# Patient Record
Sex: Male | Born: 1965 | Race: White | Hispanic: No | Marital: Married | Smoking: Former smoker
Health system: Southern US, Community
[De-identification: ages and names within clinical notes are randomized; demographics above are authoritative.]

## PROBLEM LIST (undated history)

## (undated) DIAGNOSIS — K219 Gastro-esophageal reflux disease without esophagitis: Secondary | ICD-10-CM

## (undated) DIAGNOSIS — J189 Pneumonia, unspecified organism: Secondary | ICD-10-CM

## (undated) DIAGNOSIS — J90 Pleural effusion, not elsewhere classified: Secondary | ICD-10-CM

## (undated) DIAGNOSIS — J309 Allergic rhinitis, unspecified: Secondary | ICD-10-CM

## (undated) DIAGNOSIS — I10 Essential (primary) hypertension: Secondary | ICD-10-CM

## (undated) HISTORY — PX: HERNIA REPAIR: SHX51

---

## 2004-11-12 ENCOUNTER — Ambulatory Visit: Payer: Self-pay | Admitting: Family Medicine

## 2004-11-23 ENCOUNTER — Ambulatory Visit: Payer: Self-pay | Admitting: Family Medicine

## 2016-05-08 ENCOUNTER — Inpatient Hospital Stay (HOSPITAL_COMMUNITY)
Admission: EM | Admit: 2016-05-08 | Discharge: 2016-05-18 | DRG: 163 | Disposition: A | Discharge status: Home / Self Care | Payer: BLUE CROSS/BLUE SHIELD | Attending: Surgery | Admitting: Surgery

## 2016-05-08 ENCOUNTER — Encounter (HOSPITAL_COMMUNITY): Payer: Self-pay

## 2016-05-08 ENCOUNTER — Emergency Department (HOSPITAL_COMMUNITY): Payer: BLUE CROSS/BLUE SHIELD

## 2016-05-08 DIAGNOSIS — J181 Lobar pneumonia, unspecified organism: Principal | ICD-10-CM | POA: Diagnosis present

## 2016-05-08 DIAGNOSIS — J189 Pneumonia, unspecified organism: Secondary | ICD-10-CM | POA: Diagnosis present

## 2016-05-08 DIAGNOSIS — R739 Hyperglycemia, unspecified: Secondary | ICD-10-CM | POA: Diagnosis present

## 2016-05-08 DIAGNOSIS — Z9889 Other specified postprocedural states: Secondary | ICD-10-CM

## 2016-05-08 DIAGNOSIS — F17201 Nicotine dependence, unspecified, in remission: Secondary | ICD-10-CM

## 2016-05-08 DIAGNOSIS — J869 Pyothorax without fistula: Secondary | ICD-10-CM

## 2016-05-08 DIAGNOSIS — Z09 Encounter for follow-up examination after completed treatment for conditions other than malignant neoplasm: Secondary | ICD-10-CM

## 2016-05-08 DIAGNOSIS — J948 Other specified pleural conditions: Secondary | ICD-10-CM | POA: Diagnosis not present

## 2016-05-08 DIAGNOSIS — E871 Hypo-osmolality and hyponatremia: Secondary | ICD-10-CM | POA: Diagnosis present

## 2016-05-08 DIAGNOSIS — Z79899 Other long term (current) drug therapy: Secondary | ICD-10-CM

## 2016-05-08 DIAGNOSIS — J90 Pleural effusion, not elsewhere classified: Secondary | ICD-10-CM

## 2016-05-08 DIAGNOSIS — I10 Essential (primary) hypertension: Secondary | ICD-10-CM | POA: Diagnosis present

## 2016-05-08 DIAGNOSIS — J9811 Atelectasis: Secondary | ICD-10-CM

## 2016-05-08 HISTORY — DX: Allergic rhinitis, unspecified: J30.9

## 2016-05-08 HISTORY — DX: Essential (primary) hypertension: I10

## 2016-05-08 HISTORY — DX: Pleural effusion, not elsewhere classified: J90

## 2016-05-08 LAB — CBC WITH DIFFERENTIAL/PLATELET
Basophils Absolute: 0 10*3/uL (ref 0.0–0.1)
Basophils Relative: 0 %
EOS ABS: 0.1 10*3/uL (ref 0.0–0.7)
EOS PCT: 0 %
HCT: 42.4 % (ref 39.0–52.0)
Hemoglobin: 14.3 g/dL (ref 13.0–17.0)
LYMPHS ABS: 1.1 10*3/uL (ref 0.7–4.0)
LYMPHS PCT: 5 %
MCH: 29.5 pg (ref 26.0–34.0)
MCHC: 33.7 g/dL (ref 30.0–36.0)
MCV: 87.6 fL (ref 78.0–100.0)
MONO ABS: 1.8 10*3/uL — AB (ref 0.1–1.0)
Monocytes Relative: 8 %
Neutro Abs: 18.6 10*3/uL — ABNORMAL HIGH (ref 1.7–7.7)
Neutrophils Relative %: 87 %
Platelets: 530 10*3/uL — ABNORMAL HIGH (ref 150–400)
RBC: 4.84 MIL/uL (ref 4.22–5.81)
RDW: 12.4 % (ref 11.5–15.5)
WBC: 21.5 10*3/uL — ABNORMAL HIGH (ref 4.0–10.5)

## 2016-05-08 LAB — BASIC METABOLIC PANEL
Anion gap: 12 (ref 5–15)
BUN: 19 mg/dL (ref 6–20)
CALCIUM: 8.6 mg/dL — AB (ref 8.9–10.3)
CHLORIDE: 101 mmol/L (ref 101–111)
CO2: 23 mmol/L (ref 22–32)
CREATININE: 0.91 mg/dL (ref 0.61–1.24)
GFR calc non Af Amer: >60 mL/min (ref >60)
Glucose, Bld: 154 mg/dL — ABNORMAL HIGH (ref 65–99)
Potassium: 4 mmol/L (ref 3.5–5.1)
SODIUM: 136 mmol/L (ref 135–145)

## 2016-05-08 LAB — LACTIC ACID, PLASMA: LACTIC ACID, VENOUS: 1.7 mmol/L (ref 0.5–2.0)

## 2016-05-08 MED ORDER — IPRATROPIUM-ALBUTEROL 0.5-2.5 (3) MG/3ML IN SOLN
3.0000 mL | Freq: Once | RESPIRATORY_TRACT | Status: AC
  Administered 2016-05-08: 3 mL via RESPIRATORY_TRACT
  Filled 2016-05-08: qty 3

## 2016-05-08 MED ORDER — MORPHINE SULFATE (PF) 4 MG/ML IV SOLN
4.0000 mg | Freq: Once | INTRAVENOUS | Status: AC
  Administered 2016-05-08: 4 mg via INTRAVENOUS
  Filled 2016-05-08: qty 1

## 2016-05-08 MED ORDER — ALBUTEROL SULFATE (2.5 MG/3ML) 0.083% IN NEBU
2.5000 mg | INHALATION_SOLUTION | Freq: Once | RESPIRATORY_TRACT | Status: AC
  Administered 2016-05-08: 2.5 mg via RESPIRATORY_TRACT
  Filled 2016-05-08: qty 3

## 2016-05-08 MED ORDER — DEXTROSE 5 % IV SOLN
500.0000 mg | INTRAVENOUS | Status: DC
  Administered 2016-05-09 – 2016-05-11 (×3): 500 mg via INTRAVENOUS
  Filled 2016-05-08 (×4): qty 500

## 2016-05-08 MED ORDER — CEFTRIAXONE SODIUM 2 G IJ SOLR
2.0000 g | Freq: Once | INTRAMUSCULAR | Status: AC
  Administered 2016-05-08: 2 g via INTRAVENOUS
  Filled 2016-05-08: qty 2

## 2016-05-08 MED ORDER — ACETAMINOPHEN 500 MG PO TABS
1000.0000 mg | ORAL_TABLET | Freq: Once | ORAL | Status: AC
  Administered 2016-05-08: 1000 mg via ORAL
  Filled 2016-05-08: qty 2

## 2016-05-08 MED ORDER — ONDANSETRON HCL 4 MG/2ML IJ SOLN
4.0000 mg | Freq: Once | INTRAMUSCULAR | Status: AC
  Administered 2016-05-08: 4 mg via INTRAVENOUS
  Filled 2016-05-08: qty 2

## 2016-05-08 MED ORDER — SODIUM CHLORIDE 0.9 % IV BOLUS (SEPSIS)
1000.0000 mL | Freq: Once | INTRAVENOUS | Status: AC
  Administered 2016-05-08: 1000 mL via INTRAVENOUS

## 2016-05-08 NOTE — ED Notes (Signed)
[place] [facility] [place] [facility]  Was told if I got worse to come to the ER.  Coughing, wheezing, pain in lungs, trouble breathing.

## 2016-05-08 NOTE — ED Provider Notes (Signed)
CSN: [id number]     Arrival date & time 05/08/16  2108 History   First MD Initiated Contact with Patient 05/08/16 2150     Chief Complaint  Patient presents with  . Shortness of Breath     (Consider location/radiation/quality/duration/timing/severity/associated sxs/prior Treatment) HPI   Allen Becker is a 50 y.o. male who presents to the Emergency Department complaining of shortness of breath, cough and upper chest pain for six weeks.  He states that he was initially seen by his PMD and treated with prednisone for bronchitis.  States he was not improving, so returned and given additional steroids for "sinus" reports symptoms continued to worsen and was seen two days ago at local urgent care and diagnosed with pneumonia.  Started Levaquin and advised if not improving to go to ER.  He reports continued symptoms and increasing shortness of breath, worse with exertion.  Fever max at home unknown.  He denies vomiting, abdominal pain, LE edema or hx of heart disease.  Nothing makes sx's better or worse.  Stopped smoking greater than 10 yrs ago.     Past Medical History  Diagnosis Date  . Hypertension   . Allergic rhinitis    History reviewed. No pertinent past surgical history. No family history on file. Social History  Substance Use Topics  . Smoking status: Former Games developer  . Smokeless tobacco: None  . Alcohol Use: No    Review of Systems  Constitutional: Positive for fever and chills. Negative for appetite change.  HENT: Positive for congestion. Negative for sore throat and trouble swallowing.   Respiratory: Positive for cough, chest tightness and shortness of breath. Negative for wheezing.   Cardiovascular: Positive for chest pain.  Gastrointestinal: Negative for nausea, vomiting and abdominal pain.  Genitourinary: Negative for dysuria.  Musculoskeletal: Negative for arthralgias.  Skin: Negative for rash.  Neurological: Negative for dizziness, weakness and numbness.  Hematological:  Negative for adenopathy.  All other systems reviewed and are negative.     Allergies  Review of patient's allergies indicates no known allergies.  Home Medications   Prior to Admission medications   Medication Sig Start Date End Date Taking? Authorizing Provider  amLODipine-valsartan (EXFORGE) 5-160 MG tablet Take 1 tablet by mouth daily. 03/16/16  Yes Historical Provider, MD  atorvastatin (LIPITOR) 10 MG tablet Take 10 mg by mouth daily. 01/01/16 12/31/16 Yes Historical Provider, MD  HYDROcodone-homatropine (HYCODAN) 5-1.5 MG/5ML syrup Take 5 mLs by mouth at bedtime as needed. 05/06/16  Yes Historical Provider, MD  ibuprofen (ADVIL,MOTRIN) 200 MG tablet Take 200 mg by mouth every 6 (six) hours as needed for mild pain or moderate pain.   Yes Historical Provider, MD  levofloxacin (LEVAQUIN) 750 MG tablet Take 750 mg by mouth daily. 7 day course starting on 05/06/2016 05/06/16  Yes Historical Provider, MD   BP 119/76 mmHg  Pulse 109  Temp(Src) 99.9 F (37.7 C) (Oral)  Resp 18  Ht  (1.676 m)  Wt 77.111 kg  BMI 27.45 kg/m2  SpO2 95% Physical Exam  Constitutional: He is oriented to person, place, and time. He appears well-nourished. No distress.  HENT:  Mouth/Throat: Oropharynx is clear and moist.  Eyes: Conjunctivae and EOM are normal. Pupils are equal, round, and reactive to light.  Neck: Normal range of motion. Neck supple. No JVD present.  Cardiovascular: Regular rhythm and intact distal pulses.  Tachycardia present.   Pulmonary/Chest: Effort normal. No respiratory distress.  Mild inspiratory wheezing bilaterally.  Diminished lung sounds on left  Abdominal:  Soft. He exhibits no distension. There is no tenderness. There is no rebound.  Musculoskeletal: Normal range of motion. He exhibits no edema.  Lymphadenopathy:    He has no cervical adenopathy.  Neurological: He is alert and oriented to person, place, and time.  Skin: Skin is warm.  Psychiatric: He has a normal mood and  affect.  Nursing note and vitals reviewed.   ED Course  Procedures (including critical care time) Labs Review Labs Reviewed  BASIC METABOLIC PANEL - Abnormal; Notable for the following:    Glucose, Bld 154 (*)    Calcium 8.6 (*)    All other components within normal limits  CBC WITH DIFFERENTIAL/PLATELET - Abnormal; Notable for the following:    WBC 21.5 (*)    Platelets 530 (*)    Neutro Abs 18.6 (*)    Monocytes Absolute 1.8 (*)    All other components within normal limits  LACTIC ACID, PLASMA  LACTIC ACID, PLASMA    Imaging Review Dg Chest 2 View  05/08/2016  CLINICAL DATA:  Initial evaluation for acute cough, wheezing. EXAM: CHEST  2 VIEW COMPARISON:  None. FINDINGS: Cardiac silhouette grossly within normal limits, although the left heart border is obscured on this exam. Mediastinal silhouette within normal limits. Tracheal air column mildly deviated to the right. Lungs normally inflated. AE large left pleural effusion is present. Associated left basilar atelectasis and/ or consolidation. Superimposed mass not excluded. Left lung apex is aerated. Right lung is clear. No pneumothorax. No acute osseous abnormality. IMPRESSION: Large left pleural effusion with associated left lower lobe atelectasis and/ or consolidation. Superimposed mass not excluded. Further evaluation with cross-sectional imaging is suggested to further delineate these findings. Electronically Signed   By: Rise MuBenjamin  McClintock M.D.   On: 05/08/2016 22:25   Ct Chest Wo Contrast  05/09/2016  CLINICAL DATA:  Shortness of breath. Recent diagnosis of pneumonia. Abnormal chest x-ray. EXAM: CT CHEST WITHOUT CONTRAST TECHNIQUE: Multidetector CT imaging of the chest was performed following the standard protocol without IV contrast. COMPARISON:  Chest radiographs earlier this day. FINDINGS: Large partially loculated left pleural effusion measuring simple fluid density. This causes complete collapse/ atelectasis of the left lower  lobe and moderate left upper lobe atelectasis. Tiny, 3 mm, pleural-based nodule was seen in the aerated left upper lobe posteriorly. There is mild rightward mediastinal shift secondary to large pleural effusion. Scattered air bronchograms are seen. No evidence of endobronchial lesion. No focal consolidation in the right lung. No right pleural effusion. No evidence pulmonary edema. Small mediastinal lymph nodes are likely reactive. Limited assessment for hilar adenopathy given lack of contrast. The thoracic aorta is normal in caliber. No acute abnormality in the included upper abdomen. No adrenal nodule. There are no acute or suspicious osseous abnormalities. IMPRESSION: 1. Large partially loculated left pleural effusion. This cause of complete collapse/atelectasis of the left lower lobe and moderate left upper lobe atelectasis. This limits assessment of the underlying lung parenchyma. Underlying pneumonia or pulmonary mass cannot be assessed. Recommend follow-up imaging after drainage or resolution of pleural fluid. 2. No focal abnormality in the right lung. Electronically Signed   By: Rubye OaksMelanie  Ehinger M.D.   On: 05/09/2016 00:09   I have personally reviewed and evaluated these images and lab results as part of my medical decision-making.   EKG Interpretation   Date/Time:  Saturday May 08 2016 21:29:45 EDT Ventricular Rate:  117 PR Interval:  113 QRS Duration: 77 QT Interval:  278 QTC Calculation: 388 R Axis:  55 Text Interpretation:  Sinus tachycardia Probable LVH with secondary repol  abnrm No previous ECGs available Confirmed by ZACKOWSKI  MD, SCOTT [phone])  on 05/08/2016 9:37:00 PM      MDM   Final diagnoses:  Pleural effusion on left   Pt is resting comfortably.  Non-toxic appearing.    Stopped prednisone two days ago.  Failing outpatient therapy with Levaquin.  Will start rocephin and zithromax., albuterol neb given fever improved after tylenol.  Vitals stable.  Labs and CT scan  discussed with patient.  Will consult hospitalist, Dr. Conley Rolls, for admit.    2350 Consulted Dr. Conley Rolls who agrees to admit the pt.  Will see in ER   Pauline Aus, PA-C 05/09/16 1191  Vanetta Mulders, MD 05/12/16 [phone]

## 2016-05-09 ENCOUNTER — Encounter (HOSPITAL_COMMUNITY): Payer: Self-pay | Admitting: Internal Medicine

## 2016-05-09 DIAGNOSIS — J189 Pneumonia, unspecified organism: Secondary | ICD-10-CM | POA: Diagnosis present

## 2016-05-09 DIAGNOSIS — R030 Elevated blood-pressure reading, without diagnosis of hypertension: Secondary | ICD-10-CM | POA: Diagnosis not present

## 2016-05-09 DIAGNOSIS — J90 Pleural effusion, not elsewhere classified: Secondary | ICD-10-CM | POA: Diagnosis present

## 2016-05-09 DIAGNOSIS — J181 Lobar pneumonia, unspecified organism: Secondary | ICD-10-CM | POA: Diagnosis present

## 2016-05-09 DIAGNOSIS — E871 Hypo-osmolality and hyponatremia: Secondary | ICD-10-CM | POA: Diagnosis present

## 2016-05-09 DIAGNOSIS — Z79899 Other long term (current) drug therapy: Secondary | ICD-10-CM | POA: Diagnosis not present

## 2016-05-09 DIAGNOSIS — J869 Pyothorax without fistula: Secondary | ICD-10-CM | POA: Diagnosis not present

## 2016-05-09 DIAGNOSIS — J948 Other specified pleural conditions: Secondary | ICD-10-CM | POA: Diagnosis present

## 2016-05-09 DIAGNOSIS — R739 Hyperglycemia, unspecified: Secondary | ICD-10-CM | POA: Diagnosis not present

## 2016-05-09 DIAGNOSIS — J9811 Atelectasis: Secondary | ICD-10-CM | POA: Diagnosis present

## 2016-05-09 DIAGNOSIS — J852 Abscess of lung without pneumonia: Secondary | ICD-10-CM | POA: Diagnosis not present

## 2016-05-09 DIAGNOSIS — F17201 Nicotine dependence, unspecified, in remission: Secondary | ICD-10-CM | POA: Diagnosis not present

## 2016-05-09 DIAGNOSIS — I10 Essential (primary) hypertension: Secondary | ICD-10-CM | POA: Diagnosis not present

## 2016-05-09 LAB — LACTIC ACID, PLASMA: Lactic Acid, Venous: 1 mmol/L (ref 0.5–2.0)

## 2016-05-09 LAB — PROTIME-INR
INR: 1.33 (ref 0.00–1.49)
PROTHROMBIN TIME: 16.6 s — AB (ref 11.6–15.2)

## 2016-05-09 MED ORDER — ONDANSETRON HCL 4 MG PO TABS: 4.0000 mg | ORAL_TABLET | Freq: Four times a day (QID) | ORAL | Status: DC | PRN

## 2016-05-09 MED ORDER — IRBESARTAN 150 MG PO TABS
150.0000 mg | ORAL_TABLET | Freq: Every day | ORAL | Status: DC
  Administered 2016-05-09 – 2016-05-10 (×2): 150 mg via ORAL
  Filled 2016-05-09 (×2): qty 1

## 2016-05-09 MED ORDER — DEXTROSE 5 % IV SOLN: 500.0000 mg | INTRAVENOUS | Status: DC

## 2016-05-09 MED ORDER — AMLODIPINE BESYLATE 5 MG PO TABS
5.0000 mg | ORAL_TABLET | Freq: Every day | ORAL | Status: DC
  Administered 2016-05-09 – 2016-05-10 (×2): 5 mg via ORAL
  Filled 2016-05-09 (×2): qty 1

## 2016-05-09 MED ORDER — HEPARIN SODIUM (PORCINE) 5000 UNIT/ML IJ SOLN
5000.0000 [IU] | Freq: Three times a day (TID) | INTRAMUSCULAR | Status: DC
  Administered 2016-05-09 – 2016-05-12 (×9): 5000 [IU] via SUBCUTANEOUS
  Filled 2016-05-09 (×8): qty 1

## 2016-05-09 MED ORDER — CETYLPYRIDINIUM CHLORIDE 0.05 % MT LIQD
7.0000 mL | Freq: Two times a day (BID) | OROMUCOSAL | Status: DC
  Administered 2016-05-09 – 2016-05-12 (×7): 7 mL via OROMUCOSAL

## 2016-05-09 MED ORDER — ACETAMINOPHEN 650 MG RE SUPP: 650.0000 mg | Freq: Four times a day (QID) | RECTAL | Status: DC | PRN

## 2016-05-09 MED ORDER — ATORVASTATIN CALCIUM 10 MG PO TABS
10.0000 mg | ORAL_TABLET | Freq: Every day | ORAL | Status: DC
  Administered 2016-05-09 – 2016-05-12 (×4): 10 mg via ORAL
  Filled 2016-05-09 (×4): qty 1

## 2016-05-09 MED ORDER — MORPHINE SULFATE (PF) 2 MG/ML IV SOLN
2.0000 mg | INTRAVENOUS | Status: DC | PRN
  Administered 2016-05-09 – 2016-05-12 (×9): 2 mg via INTRAVENOUS
  Filled 2016-05-09 (×9): qty 1

## 2016-05-09 MED ORDER — ONDANSETRON HCL 4 MG/2ML IJ SOLN: 4.0000 mg | Freq: Four times a day (QID) | INTRAMUSCULAR | Status: DC | PRN

## 2016-05-09 MED ORDER — DEXTROSE 5 % IV SOLN
1.0000 g | INTRAVENOUS | Status: DC
  Administered 2016-05-09 – 2016-05-11 (×2): 1 g via INTRAVENOUS
  Filled 2016-05-09 (×3): qty 10

## 2016-05-09 MED ORDER — ACETAMINOPHEN 325 MG PO TABS
650.0000 mg | ORAL_TABLET | Freq: Four times a day (QID) | ORAL | Status: DC | PRN
  Administered 2016-05-09 – 2016-05-11 (×5): 650 mg via ORAL
  Filled 2016-05-09 (×4): qty 2

## 2016-05-09 MED ORDER — AMLODIPINE BESYLATE-VALSARTAN 5-160 MG PO TABS: 1.0000 | ORAL_TABLET | Freq: Every day | ORAL | Status: DC

## 2016-05-09 NOTE — Progress Notes (Signed)
Patient seen and examined, database reviewed.  discussed with multiple family members at bedside and updated of plan of care. Patient presented overnight with shortness of breath and cough. chest x-ray and CT showed a large partially loculated left sided pleural effusion. He remains with significant work of breathing. [facility] for both diagnostic and therapeutic purposes. He remains on empiric antibiotic therapy for pneumonia, unable to fully discard malignancy given his history of tobacco abuse, cytology has been requested from pleural fluid. We'll continue to follow.   Peggye PittEstela Hernandez, MD [facility] Pager: [phone]

## 2016-05-09 NOTE — H&P (Signed)
History and Physical    Allen Becker RUE:[id number] DOB: 02-Jun-1966 DOA: 05/08/2016  Referring MD/NP/PA: Babette Relicammy triplett PCP: No primary care provider on file.  Outpatient Specialists:  Patient coming from: home  Chief Complaint: Shortness of breath and cough  HPI: Allen Becker is a 50 y.o. male with medical history significant of tobacco use (approximately 1ppd for 20 yrs) presents with complaints of productive cough, SOB, and chest pain for the past month and half. He was recently seen at an urgent care clinic and was diagnosed with PNA. He was given Levaquin and told to come to the hospital if his symptoms worsened.  He endorses subjective fever and chills. Denies any heart problems.  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 He still works and does not normally miss work.   While in the ED, BMP was unremarkable. CBC showed elevated WBC at 21.5, though his lactic acid was wnl. CXR and CT chest showed a large partially loculated left-sided pleural effusion. Hospitalist was asked to refer for admission.   Review of Systems: As per HPI otherwise 10 point review of systems negative.    Past Medical History  Diagnosis Date  . Hypertension   . Allergic rhinitis     History reviewed. No pertinent past surgical history.   reports that he has quit smoking. He does not have any smokeless tobacco history on file. He reports that he does not drink alcohol or use illicit drugs.  No Known Allergies  No family history on file.  Prior to Admission medications   Medication Sig Start Date End Date Taking? Authorizing Provider  amLODipine-valsartan (EXFORGE) 5-160 MG tablet Take 1 tablet by mouth daily. 03/16/16  Yes Historical Provider, MD  atorvastatin (LIPITOR) 10 MG tablet Take 10 mg by mouth daily. 01/01/16 12/31/16 Yes Historical Provider, MD  HYDROcodone-homatropine (HYCODAN) 5-1.5 MG/5ML syrup Take 5 mLs by mouth at bedtime  as needed. 05/06/16  Yes Historical Provider, MD  ibuprofen (ADVIL,MOTRIN) 200 MG tablet Take 200 mg by mouth every 6 (six) hours as needed for mild pain or moderate pain.   Yes Historical Provider, MD  levofloxacin (LEVAQUIN) 750 MG tablet Take 750 mg by mouth daily. 7 day course starting on 05/06/2016 05/06/16  Yes Historical Provider, MD    Physical Exam: Filed Vitals:   05/08/16 2248 05/08/16 2300 05/08/16 2301 05/09/16 0036  BP:  122/78  109/80  Pulse:  97  97  Temp: 98.3 F (36.8 C)   99.4 F (37.4 C)  TempSrc: Oral   Oral  Resp:  19  18  Height:      Weight:      SpO2:  97% 95% 92%      Constitutional: NAD, calm, comfortable Filed Vitals:   05/08/16 2248 05/08/16 2300 05/08/16 2301 05/09/16 0036  BP:  122/78  109/80  Pulse:  97  97  Temp: 98.3 F (36.8 C)   99.4 F (37.4 C)  TempSrc: Oral   Oral  Resp:  19  18  Height:      Weight:      SpO2:  97% 95% 92%   Eyes: PERRL, lids and conjunctivae normal ENMT: Mucous membranes are moist. Posterior pharynx clear of any exudate or lesions.Normal dentition.  Neck: normal, supple, no masses, no thyromegaly Respiratory: clear to auscultation bilaterally, no wheezing, no crackles. Normal respiratory effort. No accessory muscle use. E-A changes on left side.  Cardiovascular: Regular rate and rhythm, no murmurs / rubs /  gallops. No extremity edema. 2+ pedal pulses. No carotid bruits.  Abdomen: no tenderness, no masses palpated. No hepatosplenomegaly. Bowel sounds positive.  Musculoskeletal: no clubbing / cyanosis. No joint deformity upper and lower extremities. Good ROM, no contractures. Normal muscle tone.  Skin: no rashes, lesions, ulcers. No induration Neurologic: CN 2-12 grossly intact. Sensation intact, DTR normal. Strength 5/5 in all 4.  Psychiatric: Normal judgment and insight. Alert and oriented x 3. Normal mood.   Labs on Admission: I have personally reviewed following labs and imaging studies  CBC:  Recent Labs Lab  05/08/16 2132  WBC 21.5*  NEUTROABS 18.6*  HGB 14.3  HCT 42.4  MCV 87.6  PLT 530*   Basic Metabolic Panel:  Recent Labs Lab 05/08/16 2132  NA 136  K 4.0  CL 101  CO2 23  GLUCOSE 154*  BUN 19  CREATININE 0.91  CALCIUM 8.6*   Radiological Exams on Admission: Dg Chest 2 View  05/08/2016  CLINICAL DATA:  Initial evaluation for acute cough, wheezing. EXAM: CHEST  2 VIEW COMPARISON:  None. FINDINGS: Cardiac silhouette grossly within normal limits, although the left heart border is obscured on this exam. Mediastinal silhouette within normal limits. Tracheal air column mildly deviated to the right. Lungs normally inflated. AE large left pleural effusion is present. Associated left basilar atelectasis and/ or consolidation. Superimposed mass not excluded. Left lung apex is aerated. Right lung is clear. No pneumothorax. No acute osseous abnormality. IMPRESSION: Large left pleural effusion with associated left lower lobe atelectasis and/ or consolidation. Superimposed mass not excluded. Further evaluation with cross-sectional imaging is suggested to further delineate these findings. Electronically Signed   By: Rise Mu M.D.   On: 05/08/2016 22:25   Ct Chest Wo Contrast  05/09/2016  CLINICAL DATA:  Shortness of breath. Recent diagnosis of pneumonia. Abnormal chest x-ray. EXAM: CT CHEST WITHOUT CONTRAST TECHNIQUE: Multidetector CT imaging of the chest was performed following the standard protocol without IV contrast. COMPARISON:  Chest radiographs earlier this day. FINDINGS: Large partially loculated left pleural effusion measuring simple fluid density. This causes complete collapse/ atelectasis of the left lower lobe and moderate left upper lobe atelectasis. Tiny, 3 mm, pleural-based nodule was seen in the aerated left upper lobe posteriorly. There is mild rightward mediastinal shift secondary to large pleural effusion. Scattered air bronchograms are seen. No evidence of endobronchial  lesion. No focal consolidation in the right lung. No right pleural effusion. No evidence pulmonary edema. Small mediastinal lymph nodes are likely reactive. Limited assessment for hilar adenopathy given lack of contrast. The thoracic aorta is normal in caliber. No acute abnormality in the included upper abdomen. No adrenal nodule. There are no acute or suspicious osseous abnormalities. IMPRESSION: 1. Large partially loculated left pleural effusion. This cause of complete collapse/atelectasis of the left lower lobe and moderate left upper lobe atelectasis. This limits assessment of the underlying lung parenchyma. Underlying pneumonia or pulmonary mass cannot be assessed. Recommend follow-up imaging after drainage or resolution of pleural fluid. 2. No focal abnormality in the right lung. Electronically Signed   By: Rubye Oaks M.D.   On: 05/09/2016 00:09    EKG: Independently reviewed.   Assessment/Plan Active Problems:   Pleural effusion on left  1. Left-sided pleural effusion:  CT chest shows large partially loculated left pleural effusion. Concern for malignant pleural effusion, but will treat for parapneumonic effusion.  He has a hx of smoking 1ppd for 20 years, but quit approximately 10 years ago. . WBC elevated 21.5 but  he has been on Prednisone.  Treat with abx and pulmonary hygiene. He does not need any O2 at this time. Will perform thoracentesis to include serology, culture and cytology.  I will  consult pulmonology.   DVT prophylaxis: Heparin Code Status: Full Family Communication: Discussed with patient, daughter, and son present at bedside.  Disposition Plan: Discharge home once improved Consults called: Will consult Pulmonology Admission status: Admit as inpatient.   Houston Siren, MD FACP [facility]  If 7PM-7AM, please contact night-coverage [email or web address] Password [email or web address]  05/09/2016, 12:54 AM   By signing my name below, I, Adron Bene, attest that this documentation  has been prepared under the direction and in the presence of Houston Siren, MD. Electronically Signed: Adron Bene, Scribe 05/09/2016 12:55am

## 2016-05-10 ENCOUNTER — Ambulatory Visit (HOSPITAL_COMMUNITY)
Admission: RE | Admit: 2016-05-10 | Payer: Self-pay | Source: Other Acute Inpatient Hospital | Admitting: Internal Medicine

## 2016-05-10 ENCOUNTER — Inpatient Hospital Stay (HOSPITAL_COMMUNITY): Payer: BLUE CROSS/BLUE SHIELD

## 2016-05-10 ENCOUNTER — Encounter (HOSPITAL_COMMUNITY): Payer: Self-pay | Admitting: Internal Medicine

## 2016-05-10 DIAGNOSIS — J90 Pleural effusion, not elsewhere classified: Secondary | ICD-10-CM

## 2016-05-10 DIAGNOSIS — I10 Essential (primary) hypertension: Secondary | ICD-10-CM | POA: Diagnosis present

## 2016-05-10 DIAGNOSIS — R739 Hyperglycemia, unspecified: Secondary | ICD-10-CM

## 2016-05-10 HISTORY — DX: Pleural effusion, not elsewhere classified: J90

## 2016-05-10 LAB — GRAM STAIN

## 2016-05-10 LAB — BODY FLUID CELL COUNT WITH DIFFERENTIAL
EOS FL: 0 %
Lymphs, Fluid: 69 %
Monocyte-Macrophage-Serous Fluid: 9 % — ABNORMAL LOW (ref 50–90)
Neutrophil Count, Fluid: 22 % (ref 0–25)
Total Nucleated Cell Count, Fluid: 967 cu mm (ref 0–1000)

## 2016-05-10 LAB — GLUCOSE, SEROUS FLUID: Glucose, Fluid: 92 mg/dL

## 2016-05-10 LAB — COMPREHENSIVE METABOLIC PANEL
ALT: 94 U/L — ABNORMAL HIGH (ref 17–63)
ANION GAP: 8 (ref 5–15)
AST: 41 U/L (ref 15–41)
Albumin: 2.7 g/dL — ABNORMAL LOW (ref 3.5–5.0)
Alkaline Phosphatase: 147 U/L — ABNORMAL HIGH (ref 38–126)
BILIRUBIN TOTAL: 1.5 mg/dL — AB (ref 0.3–1.2)
BUN: 17 mg/dL (ref 6–20)
CO2: 26 mmol/L (ref 22–32)
Calcium: 8.2 mg/dL — ABNORMAL LOW (ref 8.9–10.3)
Chloride: 97 mmol/L — ABNORMAL LOW (ref 101–111)
Creatinine, Ser: 0.67 mg/dL (ref 0.61–1.24)
Glucose, Bld: 122 mg/dL — ABNORMAL HIGH (ref 65–99)
POTASSIUM: 3.8 mmol/L (ref 3.5–5.1)
Sodium: 131 mmol/L — ABNORMAL LOW (ref 135–145)
TOTAL PROTEIN: 7.5 g/dL (ref 6.5–8.1)

## 2016-05-10 LAB — CBC
HEMATOCRIT: 37.6 % — AB (ref 39.0–52.0)
HEMOGLOBIN: 12.7 g/dL — AB (ref 13.0–17.0)
MCH: 29.5 pg (ref 26.0–34.0)
MCHC: 33.8 g/dL (ref 30.0–36.0)
MCV: 87.2 fL (ref 78.0–100.0)
Platelets: 415 10*3/uL — ABNORMAL HIGH (ref 150–400)
RBC: 4.31 MIL/uL (ref 4.22–5.81)
RDW: 12.5 % (ref 11.5–15.5)
WBC: 19.1 10*3/uL — AB (ref 4.0–10.5)

## 2016-05-10 LAB — MRSA PCR SCREENING: MRSA by PCR: NEGATIVE

## 2016-05-10 LAB — LACTATE DEHYDROGENASE, PLEURAL OR PERITONEAL FLUID: LD FL: 226 U/L — AB (ref 3–23)

## 2016-05-10 LAB — LACTATE DEHYDROGENASE: LDH: 97 U/L — ABNORMAL LOW (ref 98–192)

## 2016-05-10 LAB — PROTEIN, BODY FLUID: Total protein, fluid: 5.6 g/dL

## 2016-05-10 MED ORDER — POTASSIUM CHLORIDE IN NACL 20-0.9 MEQ/L-% IV SOLN
INTRAVENOUS | Status: DC
  Administered 2016-05-10 – 2016-05-12 (×4): via INTRAVENOUS
  Filled 2016-05-10 (×4): qty 1000

## 2016-05-10 NOTE — Procedures (Signed)
Ultrasound-guided diagnostic and therapeutic left  thoracentesis performed yielding 460 cc of hazy, amber  fluid. No immediate complications. Follow-up chest x-ray pending. The fluid was sent to the lab for preordered studies. The pleural fluid collection was multiloculated and only the above amount could be removed today. Consider TCTS consult if clinical status does not improve.

## 2016-05-10 NOTE — Progress Notes (Addendum)
PROGRESS NOTE    Allen Becker  WUJ:[id number] DOB: [birth year] DOA: 05/08/2016 PCP: No primary care provider on file. (Confirm with patient/family/NH records and if not entered, this HAS to be entered at [facility] point of entry. "No PCP" if truly none.)   Brief Narrative: Patient is a 50 year old man with history of tobacco use, but quit 10 years ago and hypertension, who presented to the ED on 05/09/2016 with shortness of breath and cough. In the ED, he was febrile with a temperature 101.6, mildly tachycardic, and oxygenating in the 90s on oxygen. His lactic acid was normal. His W BC was 21.5, but he had apparently been on outpatient prednisone. Chest CT revealed large partially loculated left pleural effusion with collapse/atelectasis of the left lower lobe and moderate left upper lobe atelectasis; underlying pneumonia or mass cannot be assessed. He was admitted for further evaluation and management.   Assessment & Plan:   Principal Problem:   Loculated pleural effusion Active Problems:   Pleural effusion on left   CAP (community acquired pneumonia)   Tobacco abuse, in remission   Hyperglycemia   1. Community-acquired pneumonia with large left partially loculated pleural effusion. Findings noted on admission CT scan. He had a significant leukocytosis on admission, but reportedly, he had been on prednisone in the outpatient setting. Lactic acid was normal. Patient was started on azithromycin, Rocephin, and supportive treatment with oxygen. Patient is febrile. -Therapeutic and diagnostic thoracentesis ordered. Will add a thoracentesis panel with cytology for evaluation. -We'll order follow-up laboratory studies this morning. -We'll order blood cultures. Will screen for MRSA. If positive, will add vancomycin. -Pulmonology consultation pending.  Hypertension. Patient is treated with amlodipine-valsartan chronically. His blood pressure is on the low normal side, so will discontinue the BP med.  Will start gentle IV fluids.   Hyperglycemia. We'll order hemoglobin A1c and check glucose on follow-up bmet. Will add sliding-scale NovoLog if needed.   DVT prophylaxis: Subcutaneous heparin Code Status: Full code Family Communication: None Disposition Plan: Discharge to home when clinically appropriate   Consultants:   Pulmonology, pending  Procedures:   Thoracentesis by IR, 05/10/16, pending  Antimicrobials:   Azithromycin 5/28>>  Rocephin 5/28>>    Subjective: Patient still has some shortness of breath, but he says is better.  Objective: Filed Vitals:   05/09/16 0233 05/09/16 1428 05/09/16 2158 05/10/16 0636  BP: 132/87 136/89 107/76 118/70  Pulse: 98 93 96 92  Temp: 98.7 F (37.1 C) 100.8 F (38.2 C) 103 F (39.4 C) 101.8 F (38.8 C)  TempSrc: Oral Oral Oral Oral  Resp: 15 18 18 18   Height: 5\' 6"  (1.676 m)     Weight: 72.576 kg (160 lb)     SpO2: 94% 98% 100% 99%    Intake/Output Summary (Last 24 hours) at 05/10/16 0805 Last data filed at 05/09/16 1300  Gross per 24 hour  Intake    480 ml  Output      4 ml  Net    476 ml   Filed Weights   05/08/16 2116 05/09/16 0233  Weight: 77.111 kg (170 lb) 72.576 kg (160 lb)    Examination:  General exam: 50 year old man who looks chronically ill, but in no acute distress.  Respiratory system:  Few crackles on the left, but overall decreased breath sounds on the left greater than the right. Breathing mildly labored with speaking. Cardiovascular system: S1 & S2 heard, RRR. No JVD, murmurs, rubs, gallops or clicks. No pedal edema. Gastrointestinal system: Abdomen is nondistended,  soft and nontender. No organomegaly or masses felt. Normal bowel sounds heard. Central nervous system: Alert and oriented. No focal neurological deficits. Extremities: Symmetric 5 x 5 power. Skin: No rashes, lesions or ulcers Psychiatry: Judgement and insight appear normal. Mood & affect appropriate.     Data Reviewed: I have  personally reviewed following labs and imaging studies  CBC:  Recent Labs Lab 05/08/16 2132  WBC 21.5*  NEUTROABS 18.6*  HGB 14.3  HCT 42.4  MCV 87.6  PLT 530*   Basic Metabolic Panel:  Recent Labs Lab 05/08/16 2132  NA 136  K 4.0  CL 101  CO2 23  GLUCOSE 154*  BUN 19  CREATININE 0.91  CALCIUM 8.6*   GFR: Estimated Creatinine Clearance: 88.6 mL/min (by C-G formula based on Cr of 0.91). Liver Function Tests: No results for input(s): AST, ALT, ALKPHOS, BILITOT, PROT, ALBUMIN in the last 168 hours. No results for input(s): LIPASE, AMYLASE in the last 168 hours. No results for input(s): AMMONIA in the last 168 hours. Coagulation Profile:  Recent Labs Lab 05/08/16 2138  INR 1.33   Cardiac Enzymes: No results for input(s): CKTOTAL, CKMB, CKMBINDEX, TROPONINI in the last 168 hours. BNP (last 3 results) No results for input(s): PROBNP in the last 8760 hours. HbA1C: No results for input(s): HGBA1C in the last 72 hours. CBG: No results for input(s): GLUCAP in the last 168 hours. Lipid Profile: No results for input(s): CHOL, HDL, LDLCALC, TRIG, CHOLHDL, LDLDIRECT in the last 72 hours. Thyroid Function Tests: No results for input(s): TSH, T4TOTAL, FREET4, T3FREE, THYROIDAB in the last 72 hours. Anemia Panel: No results for input(s): VITAMINB12, FOLATE, FERRITIN, TIBC, IRON, RETICCTPCT in the last 72 hours. Sepsis Labs:  Recent Labs Lab 05/08/16 2132 05/09/16 0012  LATICACIDVEN 1.7 1.0    No results found for this or any previous visit (from the past 240 hour(s)).       Radiology Studies: Dg Chest 2 View  05/08/2016  CLINICAL DATA:  Initial evaluation for acute cough, wheezing. EXAM: CHEST  2 VIEW COMPARISON:  None. FINDINGS: Cardiac silhouette grossly within normal limits, although the left heart border is obscured on this exam. Mediastinal silhouette within normal limits. Tracheal air column mildly deviated to the right. Lungs normally inflated. AE large  left pleural effusion is present. Associated left basilar atelectasis and/ or consolidation. Superimposed mass not excluded. Left lung apex is aerated. Right lung is clear. No pneumothorax. No acute osseous abnormality. IMPRESSION: Large left pleural effusion with associated left lower lobe atelectasis and/ or consolidation. Superimposed mass not excluded. Further evaluation with cross-sectional imaging is suggested to further delineate these findings. Electronically Signed   By: Rise Mu M.D.   On: 05/08/2016 22:25   Ct Chest Wo Contrast  05/09/2016  CLINICAL DATA:  Shortness of breath. Recent diagnosis of pneumonia. Abnormal chest x-ray. EXAM: CT CHEST WITHOUT CONTRAST TECHNIQUE: Multidetector CT imaging of the chest was performed following the standard protocol without IV contrast. COMPARISON:  Chest radiographs earlier this day. FINDINGS: Large partially loculated left pleural effusion measuring simple fluid density. This causes complete collapse/ atelectasis of the left lower lobe and moderate left upper lobe atelectasis. Tiny, 3 mm, pleural-based nodule was seen in the aerated left upper lobe posteriorly. There is mild rightward mediastinal shift secondary to large pleural effusion. Scattered air bronchograms are seen. No evidence of endobronchial lesion. No focal consolidation in the right lung. No right pleural effusion. No evidence pulmonary edema. Small mediastinal lymph nodes are likely reactive. Limited  assessment for hilar adenopathy given lack of contrast. The thoracic aorta is normal in caliber. No acute abnormality in the included upper abdomen. No adrenal nodule. There are no acute or suspicious osseous abnormalities. IMPRESSION: 1. Large partially loculated left pleural effusion. This cause of complete collapse/atelectasis of the left lower lobe and moderate left upper lobe atelectasis. This limits assessment of the underlying lung parenchyma. Underlying pneumonia or pulmonary mass  cannot be assessed. Recommend follow-up imaging after drainage or resolution of pleural fluid. 2. No focal abnormality in the right lung. Electronically Signed   By: Rubye OaksMelanie  Ehinger M.D.   On: 05/09/2016 00:09        Scheduled Meds: . amLODipine  5 mg Oral Daily   And  . irbesartan  150 mg Oral Daily  . antiseptic oral rinse  7 mL Mouth Rinse BID  . atorvastatin  10 mg Oral Daily  . azithromycin  500 mg Intravenous Q24H  . cefTRIAXone (ROCEPHIN)  IV  1 g Intravenous Q24H  . heparin  5,000 Units Subcutaneous Q8H   Continuous Infusions:    LOS: 1 day    Time spent: 35 minutes    Elliot CousinFISHER,Sheryl Towell, MD [facility] Pager [phone]   If 7PM-7AM, please contact night-coverage [email or web address] Password [email or web address] 05/10/2016, 8:05 AM

## 2016-05-11 DIAGNOSIS — E871 Hypo-osmolality and hyponatremia: Secondary | ICD-10-CM | POA: Diagnosis present

## 2016-05-11 LAB — CBC
HEMATOCRIT: 36.6 % — AB (ref 39.0–52.0)
Hemoglobin: 12.2 g/dL — ABNORMAL LOW (ref 13.0–17.0)
MCH: 29.2 pg (ref 26.0–34.0)
MCHC: 33.3 g/dL (ref 30.0–36.0)
MCV: 87.6 fL (ref 78.0–100.0)
Platelets: 436 10*3/uL — ABNORMAL HIGH (ref 150–400)
RBC: 4.18 MIL/uL — AB (ref 4.22–5.81)
RDW: 12.4 % (ref 11.5–15.5)
WBC: 16.6 10*3/uL — AB (ref 4.0–10.5)

## 2016-05-11 LAB — BASIC METABOLIC PANEL
Anion gap: 8 (ref 5–15)
BUN: 17 mg/dL (ref 6–20)
CHLORIDE: 100 mmol/L — AB (ref 101–111)
CO2: 27 mmol/L (ref 22–32)
Calcium: 8 mg/dL — ABNORMAL LOW (ref 8.9–10.3)
Creatinine, Ser: 0.66 mg/dL (ref 0.61–1.24)
GFR calc non Af Amer: >60 mL/min (ref >60)
Glucose, Bld: 137 mg/dL — ABNORMAL HIGH (ref 65–99)
POTASSIUM: 3.7 mmol/L (ref 3.5–5.1)
SODIUM: 135 mmol/L (ref 135–145)

## 2016-05-11 LAB — HEMOGLOBIN A1C
HEMOGLOBIN A1C: 5.6 % (ref 4.8–5.6)
Mean Plasma Glucose: 114 mg/dL

## 2016-05-11 LAB — PH, BODY FLUID: pH, Body Fluid: 7.7

## 2016-05-11 MED ORDER — PIPERACILLIN-TAZOBACTAM 3.375 G IVPB
3.3750 g | Freq: Three times a day (TID) | INTRAVENOUS | Status: DC
  Administered 2016-05-11 – 2016-05-14 (×9): 3.375 g via INTRAVENOUS
  Filled 2016-05-11 (×11): qty 50

## 2016-05-11 NOTE — Care Management Note (Signed)
Case Management Note  Patient Details  Name: Lovett SoxBrian Bomba MRN: [id number] Date of Birth: Jul 01, 1966  Subjective/Objective: Patient is from home, Has a teenage son that lives with him. Patient is independent with ADL's. Reports he drives himself to appointments and has no issues obtaining meds.                     Action/Plan: No CM needs at this time, but will cont. To follow.    Expected Discharge Date:                  Expected Discharge Plan:  Home/Self Care  In-House Referral:     Discharge planning Services  CM Consult  Post Acute Care Choice:  NA Choice offered to:  NA  DME Arranged:    DME Agency:     HH Arranged:    HH Agency:     Status of Service:  In process, will continue to follow  Medicare Important Message Given:    Date Medicare IM Given:    Medicare IM give by:    Date Additional Medicare IM Given:    Additional Medicare Important Message give by:     If discussed at Long Length of Stay Meetings, dates discussed:    Additional Comments:  Meela Wareing, Chrystine OilerSharley Diane, RN 05/11/2016, 9:49 AM

## 2016-05-11 NOTE — Progress Notes (Signed)
Report [facility].

## 2016-05-11 NOTE — Consult Note (Signed)
Consult requested by: [facility] Consult requested for loculated pleural effusion:  HPI: This is a 50 year old who has been having cough shortness of breath and chest pain for about a month and a half. He had gone to an urgent care center was diagnosed with pneumonia and was given Levaquin for that but got worse and came to the hospital. His chest x-ray and CT showed a large partially loculated left pleural effusion. He had thoracentesis yesterday. He says he feels a little bit better. He has a exudative pleural effusion based on pleural fluid studies. He has a significant smoking history in the past of about 30 pack years  Past Medical History  Diagnosis Date  . Hypertension   . Allergic rhinitis   . Loculated pleural effusion 05/10/2016     History reviewed. No pertinent family history.   Social History   Social History  . Marital Status: Married    Spouse Name: N/A  . Number of Children: N/A  . Years of Education: N/A   Social History Main Topics  . Smoking status: Former Games developer  . Smokeless tobacco: None  . Alcohol Use: No  . Drug Use: No  . Sexual Activity: Not Asked   Other Topics Concern  . None   Social History Narrative     ROS: He says he has lost some weight. He's had pain. His pain is better. He has felt feverish. He has a lot of sweating. He's had fairly poor appetite. He's coughing up some sputum. It is generally clear now. Otherwise per the history and physical which I reviewed    Objective: Vital signs in last 24 hours: Temp:  [99.4 F (37.4 C)-103.3 F (39.6 C)] 101.1 F (38.4 C) (05/30 0602) Pulse Rate:  [94-98] 94 (05/30 0602) Resp:  [20] 20 (05/30 0602) BP: (64-144)/(44-123) 96/56 mmHg (05/30 0712) SpO2:  [95 %-97 %] 97 % (05/30 0602) Weight change:  Last BM Date: 05/08/16  Intake/Output from previous day: 05/29 0701 - 05/30 0700 In: 240 [P.O.:240] Out: 300 [Urine:300]  PHYSICAL EXAM He is awake and alert and looks acutely sick.  His pupils are reactive nose and throat are clear. His neck is supple without masses. His chest shows still some diminished breath sounds on the left and some rhonchi bilaterally. His heart is regular without gallop. His abdomen is soft without masses. Extremities show no edema. Central nervous system examination is grossly intact  Lab Results: Basic Metabolic Panel:  Recent Labs  40/98/11 2132 05/10/16 0841  NA 136 131*  K 4.0 3.8  CL 101 97*  CO2 23 26  GLUCOSE 154* 122*  BUN 19 17  CREATININE 0.91 0.67  CALCIUM 8.6* 8.2*   Liver Function Tests:  Recent Labs  05/10/16 0841  AST 41  ALT 94*  ALKPHOS 147*  BILITOT 1.5*  PROT 7.5  ALBUMIN 2.7*   No results for input(s): LIPASE, AMYLASE in the last 72 hours. No results for input(s): AMMONIA in the last 72 hours. CBC:  Recent Labs  05/08/16 2132 05/10/16 0841  WBC 21.5* 19.1*  NEUTROABS 18.6*  --   HGB 14.3 12.7*  HCT 42.4 37.6*  MCV 87.6 87.2  PLT 530* 415*   Cardiac Enzymes: No results for input(s): CKTOTAL, CKMB, CKMBINDEX, TROPONINI in the last 72 hours. BNP: No results for input(s): PROBNP in the last 72 hours. D-Dimer: No results for input(s): DDIMER in the last 72 hours. CBG: No results for input(s): GLUCAP in the last 72 hours. Hemoglobin A1C:  Recent Labs  05/10/16 0841  HGBA1C 5.6   Fasting Lipid Panel: No results for input(s): CHOL, HDL, LDLCALC, TRIG, CHOLHDL, LDLDIRECT in the last 72 hours. Thyroid Function Tests: No results for input(s): TSH, T4TOTAL, FREET4, T3FREE, THYROIDAB in the last 72 hours. Anemia Panel: No results for input(s): VITAMINB12, FOLATE, FERRITIN, TIBC, IRON, RETICCTPCT in the last 72 hours. Coagulation:  Recent Labs  05/08/16 2138  LABPROT 16.6*  INR 1.33   Urine Drug Screen: Drugs of Abuse  No results found for: LABOPIA, COCAINSCRNUR, LABBENZ, AMPHETMU, THCU, LABBARB  Alcohol Level: No results for input(s): ETH in the last 72 hours. Urinalysis: No results  for input(s): COLORURINE, LABSPEC, PHURINE, GLUCOSEU, HGBUR, BILIRUBINUR, KETONESUR, PROTEINUR, UROBILINOGEN, NITRITE, LEUKOCYTESUR in the last 72 hours.  Invalid input(s): APPERANCEUR Misc. Labs:   ABGS: No results for input(s): PHART, PO2ART, TCO2, HCO3 in the last 72 hours.  Invalid input(s): PCO2   MICROBIOLOGY: Recent Results (from the past 240 hour(s))  MRSA PCR Screening     Status: None   Collection Time: 05/10/16  8:45 AM  Result Value Ref Range Status   MRSA by PCR NEGATIVE NEGATIVE Final    Comment:        The GeneXpert MRSA Assay (FDA approved for NASAL specimens only), is one component of a comprehensive MRSA colonization surveillance program. It is not intended to diagnose MRSA infection nor to guide or monitor treatment for MRSA infections.   Culture, body fluid-bottle     Status: None (Preliminary result)   Collection Time: 05/10/16 11:28 AM  Result Value Ref Range Status   Specimen Description FLUID PLEURAL LEFT  Final   Special Requests NONE  Final   Culture   Final    NO GROWTH < 12 HOURS Performed at [facility]    Report Status PENDING  Incomplete  Gram stain     Status: None   Collection Time: 05/10/16 11:28 AM  Result Value Ref Range Status   Specimen Description FLUID PLEURAL LEFT  Final   Special Requests NONE  Final   Gram Stain   Final    FEW WBC PRESENT, PREDOMINANTLY MONONUCLEAR NO ORGANISMS SEEN [facility]formed [facility]    Report Status 05/10/2016 FINAL  Final    Studies/Results: Dg Chest 1 View  05/10/2016  CLINICAL DATA:  Status post left-sided thoracentesis. EXAM: CHEST 1 VIEW COMPARISON:  CT of 05/08/2016 FINDINGS: Minimal tracheal deviation right. Normal heart size. Moderate left-sided loculated pleural effusion is similar. No pneumothorax. Clear right lung. Near complete opacification of the inferior left hemi thorax is not significantly changed. IMPRESSION: No pneumothorax. Similar moderate loculated left  pleural effusion with adjacent collapse/ consolidation. Electronically Signed   By: Jeronimo GreavesKyle  Talbot M.D.   On: 05/10/2016 11:39   [place] Thoracentesis Asp Pleural Space W/img Guide  05/10/2016  INDICATION: Smoker, dyspnea, cough, pneumonia, left pleural effusion. Request made for diagnostic and therapeutic left thoracentesis. EXAM: ULTRASOUND GUIDED DIAGNOSTIC AND THERAPEUTIC LEFT THORACENTESIS MEDICATIONS: None. COMPLICATIONS: None immediate. PROCEDURE: An ultrasound guided thoracentesis was thoroughly discussed with the patient and questions answered. The benefits, risks, alternatives and complications were also discussed. The patient understands and wishes to proceed with the procedure. Written consent was obtained. Ultrasound was performed to localize and mark an adequate pocket of fluid in the left chest. The area was then prepped and draped in the normal sterile fashion. 1% Lidocaine was used for local anesthesia. Under ultrasound guidance a Safe-T-Centesis catheter was introduced. Thoracentesis was performed. The catheter was removed  and a dressing applied. FINDINGS: A total of approximately 460 cc of hazy, amber fluid was removed. Samples were sent to the laboratory as requested by the clinical team. The pleural fluid collection was extensively multiloculated by today's US imaging. Only the above amount could be removed at this time. Recommend TCTS consult if clinical status does not improve. IMPRESSION: Successful ultrasound guided diagnostic and therapeutic left thoracentesis yielding 460 cc of pleural fluid. Read by: Jeananne Rama, PA-C Electronically Signed   By: Malachy Moan M.D.   On: 05/10/2016 11:19    Medications:  Prior to Admission:  Prescriptions prior to admission  Medication Sig Dispense Refill Last Dose  . amLODipine-valsartan (EXFORGE) 5-160 MG tablet Take 1 tablet by mouth daily.  4 05/08/2016 at Unknown time  . atorvastatin (LIPITOR) 10 MG tablet Take 10 mg by mouth daily.    05/08/2016 at Unknown time  . HYDROcodone-homatropine (HYCODAN) 5-1.5 MG/5ML syrup Take 5 mLs by mouth at bedtime as needed.   05/08/2016 at Unknown time  . ibuprofen (ADVIL,MOTRIN) 200 MG tablet Take 200 mg by mouth every 6 (six) hours as needed for mild pain or moderate pain.   05/08/2016 at Unknown time  . levofloxacin (LEVAQUIN) 750 MG tablet Take 750 mg by mouth daily. 7 day course starting on 05/06/2016   05/08/2016 at Unknown time   Scheduled: . antiseptic oral rinse  7 mL Mouth Rinse BID  . atorvastatin  10 mg Oral Daily  . azithromycin  500 mg Intravenous Q24H  . cefTRIAXone (ROCEPHIN)  IV  1 g Intravenous Q24H  . heparin  5,000 Units Subcutaneous Q8H   Continuous: . 0.9 % NaCl with KCl 20 mEq / L 70 mL/hr at 05/11/16 0525   ZOX:[id number] **OR** acetaminophen, morphine injection, ondansetron **OR** ondansetron (ZOFRAN) IV  Assesment: He has community-acquired pneumonia and a loculated pleural effusion. Her fluid studies suggest exudative effusion. This may require thoracic surgery consultation and perhaps VATS procedure Principal Problem:   Loculated pleural effusion Active Problems:   Pleural effusion on left   Tobacco abuse, in remission   CAP (community acquired pneumonia)   Hyperglycemia   HTN (hypertension)    Plan: Discuss with thoracic surgery. I discussed his situation with Dr. Sherrie Mustache    LOS: 2 days   Che Rachal L 05/11/2016, 8:24 AM

## 2016-05-11 NOTE — Progress Notes (Signed)
Patient transferred [facility] step[facility] unit via [facility] in NAD, report was called by previous day shift RN.

## 2016-05-11 NOTE — Progress Notes (Signed)
Pharmacy Antibiotic Note  Allen Becker is Becker 50 y.o. male admitted on 05/08/2016 with pneumonia.  Pharmacy has been consulted for ZOSYN dosing.  Plan: Zosyn 3.375g IV q8h (4 hour infusion).  Monitor labs, progress, renal fxn, and c/s  Height: 5\' 6"  (167.6 cm) Weight: 160 lb (72.576 kg) IBW/kg (Calculated) : 63.8  Temp (24hrs), Avg:100.8 F (38.2 C), Min:99.3 F (37.4 C), Max:103.3 F (39.6 C)   Recent Labs Lab 05/08/16 2132 05/09/16 0012 05/10/16 0841 05/11/16 1020  WBC 21.5*  --  19.1* 16.6*  CREATININE 0.91  --  0.67 0.66  LATICACIDVEN 1.7 1.0  --   --     Estimated Creatinine Clearance: 100.8 mL/min (by C-G formula based on Cr of 0.66).    No Known Allergies  Antimicrobials this admission: rocephin 5/28 >> 5/30 zithromax 5/28 >> 5/30 Zosyn 5/30 >>  Microbiology results: 5/29 BCx: pending 5/29 body fluid: negative  5/29 MRSA PCR: negative  Thank you for allowing pharmacy to be Becker part of this patient's care.  Allen Becker, Allen Becker 05/11/2016 11:42 AM

## 2016-05-11 NOTE — Progress Notes (Signed)
PROGRESS NOTE    Allen Becker  ZOX:[id number] DOB: 1966-01-09 DOA: 05/08/2016 PCP: No primary care provider on file.    Brief Narrative: Patient is a 50 year old man with history of tobacco use, but quit 10 years ago and hypertension, who presented to the ED on 05/09/2016 with shortness of breath and cough. In the ED, he was febrile with a temperature 101.6, mildly tachycardic, and oxygenating in the 90s on oxygen. His lactic acid was normal. His WBC was 21.5, but he had apparently been on outpatient prednisone. Chest CT revealed large partially loculated left pleural effusion with collapse/atelectasis of the left lower lobe and moderate left upper lobe atelectasis; underlying pneumonia or mass cannot be assessed. He was admitted for further management. Patient underwent therapeutic/diagnostic thoracentesis on 5/29 yielding 460 cc of fluid. Fluid analysis suggests a parapneumonic effusion, but cytology is pending. Pulmonologist, Dr. Juanetta Gosling was consulted. He recommended that the patient be evaluated by thoracic surgery for a potential VATS procedure. He discussed the patient with Dr. Lavinia Sharps who was in agreement to see the patient in consultation for possible VATS procedure. Patient will be transferred to the hospitalist service to telemetry or the stepdown unit where Dr. Isidoro Donning is the accepting physician. Dr. Lavinia Sharps will need to be notified when the patient arrives to [facility].   Assessment & Plan:   Principal Problem:   Loculated pleural effusion Active Problems:   Pleural effusion on left   CAP (community acquired pneumonia)   Tobacco abuse, in remission   Hyperglycemia   HTN (hypertension)   Hyponatremia   1. Community-acquired pneumonia with large left partially loculated pleural effusion. Findings noted on admission CT scan. He had a significant leukocytosis on admission, but reportedly, he had been on prednisone in the outpatient setting. Lactic acid was normal. Patient was started on  azithromycin, Rocephin, and supportive treatment with oxygen. Patient is febrile. -Therapeutic and diagnostic thoracentesis performed by IR at [facility] on 5/29. Results were significant for the WBC of 967, 69 lymphocytes 22 neutrophils, and 9 monocytes; glucose 92, LDH 226, protein 5.6. Pleural fluid culture is negative today. -Blood cultures were ordered and are negative to date. He is still febrile. His white blood cell count has improved. -Vancomycin added on 5/29 for MRSA screen positive. Patient is still spiking through antibiotics, so will discontinue azithromycin and Rocephin and start Zosyn. -Pulmonologist, Dr. Juanetta Gosling was consulted. He recommended that the patient be evaluated by thoracic surgery for a potential VATS procedure. He discussed the patient with Dr. Lavinia Sharps who was in agreement to see the patient in consultation for possible VATS procedure. Patient will be transferred to the hospitalist service to telemetry or the stepdown unit where Dr. Isidoro Donning is the accepting physician. Dr. Lavinia Sharps will need to be notified when the patient arrives to [facility].  Hypertension. Patient is treated with amlodipine-valsartan chronically. His blood pressure is on the low normal side, so BP med was discontinued. Gentle IV fluids were started.  Hyperglycemia. Patient's blood glucose was modestly elevated. Hemoglobin A1c was ordered and found to be within normal limits at 5.6. We'll continue to monitor.  Hyponatremia. Patient's serum sodium was 136 on admission. It fell to 131. He was started on gentle IV fluids with normal saline. -Follow-up basic metabolic panel results are pending.   DVT prophylaxis: Subcutaneous heparin Code Status: Full code Family Communication: None Disposition Plan: Transfer to [facility] for further management.   Consultants:   CVTS, Dr. Lavinia Sharps pending  Pulmonology, Dr. Juanetta Gosling at [facility]  Procedures:   Thoracentesis  by IR, 05/10/16, yielded 460 cc of fluid.  Antimicrobials:  Zosyn  05/11/16>> Vancomycin 05/10/16>> Azithromycin 5/28>> 05/11/16 Rocephin 5/28>> 05/11/16   Subjective: Patient still has some shortness of breath, but he says is better. He does have some left-sided pleuritic pain which is eased with analgesics.  Objective: Filed Vitals:   05/10/16 1314 05/10/16 2019 05/11/16 0602 05/11/16 0712  BP: 108/64 117/62 144/123 96/56  Pulse: 95 98 94   Temp: 99.4 F (37.4 C) 103.3 F (39.6 C) 101.1 F (38.4 C)   TempSrc: Oral Oral Oral   Resp: 20 20 20    Height:      Weight:      SpO2: 97% 95% 97%     Intake/Output Summary (Last 24 hours) at 05/11/16 1115 Last data filed at 05/11/16 0958  Gross per 24 hour  Intake    360 ml  Output    300 ml  Net     60 ml   Filed Weights   05/08/16 2116 05/09/16 0233  Weight: 77.111 kg (170 lb) 72.576 kg (160 lb)    Examination:  General exam: 50 year old man who looks chronically ill, but in no acute distress.  Respiratory system:  Few crackles on the left, but overall decreased breath sounds on the left greater than the right. Breathing mildly labored with speaking. Cardiovascular system: S1 & S2 heard, RRR. No JVD, murmurs, rubs, gallops or clicks. No pedal edema. Gastrointestinal system: Abdomen is nondistended, soft and nontender. No organomegaly or masses felt. Normal bowel sounds heard. Central nervous system: Alert and oriented. No focal neurological deficits. Extremities: Symmetric 5 x 5 power. Skin: No rashes, lesions or ulcers Psychiatry: Judgement and insight appear normal. Mood & affect appropriate.     Data Reviewed: I have personally reviewed following labs and imaging studies  CBC:  Recent Labs Lab 05/08/16 2132 05/10/16 0841 05/11/16 1020  WBC 21.5* 19.1* 16.6*  NEUTROABS 18.6*  --   --   HGB 14.3 12.7* 12.2*  HCT 42.4 37.6* 36.6*  MCV 87.6 87.2 87.6  PLT 530* 415* 436*   Basic Metabolic Panel:  Recent Labs Lab 05/08/16 2132 05/10/16 0841 05/11/16 1020  NA 136 131* 135  K  4.0 3.8 3.7  CL 101 97* 100*  CO2 23 26 27   GLUCOSE 154* 122* 137*  BUN 19 17 17   CREATININE 0.91 0.67 0.66  CALCIUM 8.6* 8.2* 8.0*   GFR: Estimated Creatinine Clearance: 100.8 mL/min (by C-G formula based on Cr of 0.66). Liver Function Tests:  Recent Labs Lab 05/10/16 0841  AST 41  ALT 94*  ALKPHOS 147*  BILITOT 1.5*  PROT 7.5  ALBUMIN 2.7*   No results for input(s): LIPASE, AMYLASE in the last 168 hours. No results for input(s): AMMONIA in the last 168 hours. Coagulation Profile:  Recent Labs Lab 05/08/16 2138  INR 1.33   Cardiac Enzymes: No results for input(s): CKTOTAL, CKMB, CKMBINDEX, TROPONINI in the last 168 hours. BNP (last 3 results) No results for input(s): PROBNP in the last 8760 hours. HbA1C:  Recent Labs  05/10/16 0841  HGBA1C 5.6   CBG: No results for input(s): GLUCAP in the last 168 hours. Lipid Profile: No results for input(s): CHOL, HDL, LDLCALC, TRIG, CHOLHDL, LDLDIRECT in the last 72 hours. Thyroid Function Tests: No results for input(s): TSH, T4TOTAL, FREET4, T3FREE, THYROIDAB in the last 72 hours. Anemia Panel: No results for input(s): VITAMINB12, FOLATE, FERRITIN, TIBC, IRON, RETICCTPCT in the last 72 hours. Sepsis Labs:  Recent Labs Lab 05/08/16  2132 05/09/16 0012  LATICACIDVEN 1.7 1.0    Recent Results (from the past 240 hour(s))  Culture, blood (Routine X 2) w Reflex to ID Panel     Status: None (Preliminary result)   Collection Time: 05/10/16  8:41 AM  Result Value Ref Range Status   Specimen Description BLOOD RIGHT ANTECUBITAL  Final   Special Requests BOTTLES DRAWN AEROBIC AND ANAEROBIC 12CC EACH  Final   Culture NO GROWTH 1 DAY  Final   Report Status PENDING  Incomplete  MRSA PCR Screening     Status: None   Collection Time: 05/10/16  8:45 AM  Result Value Ref Range Status   MRSA by PCR NEGATIVE NEGATIVE Final    Comment:        The GeneXpert MRSA Assay (FDA approved for NASAL specimens only), is one component of  a comprehensive MRSA colonization surveillance program. It is not intended to diagnose MRSA infection nor to guide or monitor treatment for MRSA infections.   Culture, blood (Routine X 2) w Reflex to ID Panel     Status: None (Preliminary result)   Collection Time: 05/10/16  8:46 AM  Result Value Ref Range Status   Specimen Description BLOOD LEFT ANTECUBITAL  Final   Special Requests BOTTLES DRAWN AEROBIC AND ANAEROBIC 12CC EACH  Final   Culture NO GROWTH 1 DAY  Final   Report Status PENDING  Incomplete  Culture, body fluid-bottle     Status: None (Preliminary result)   Collection Time: 05/10/16 11:28 AM  Result Value Ref Range Status   Specimen Description FLUID PLEURAL LEFT  Final   Special Requests NONE  Final   Culture   Final    NO GROWTH < 24 HOURS Performed at [facility]    Report Status PENDING  Incomplete  Gram stain     Status: None   Collection Time: 05/10/16 11:28 AM  Result Value Ref Range Status   Specimen Description FLUID PLEURAL LEFT  Final   Special Requests NONE  Final   Gram Stain   Final    FEW WBC PRESENT, PREDOMINANTLY MONONUCLEAR NO ORGANISMS SEEN Performed at [facility]    Report Status 05/10/2016 FINAL  Final         Radiology Studies: Dg Chest 1 View  05/10/2016  CLINICAL DATA:  Status post left-sided thoracentesis. EXAM: CHEST 1 VIEW COMPARISON:  CT of 05/08/2016 FINDINGS: Minimal tracheal deviation right. Normal heart size. Moderate left-sided loculated pleural effusion is similar. No pneumothorax. Clear right lung. Near complete opacification of the inferior left hemi thorax is not significantly changed. IMPRESSION: No pneumothorax. Similar moderate loculated left pleural effusion with adjacent collapse/ consolidation. Electronically Signed   By: Jeronimo Greaves M.D.   On: 05/10/2016 11:39   US Thoracentesis Asp Pleural Space W/img Guide  05/10/2016  INDICATION: Smoker, dyspnea, cough, pneumonia, left pleural effusion.  Request made for diagnostic and therapeutic left thoracentesis. EXAM: ULTRASOUND GUIDED DIAGNOSTIC AND THERAPEUTIC LEFT THORACENTESIS MEDICATIONS: None. COMPLICATIONS: None immediate. PROCEDURE: An ultrasound guided thoracentesis was thoroughly discussed with the patient and questions answered. The benefits, risks, alternatives and complications were also discussed. The patient understands and wishes to proceed with the procedure. Written consent was obtained. Ultrasound was performed to localize and mark an adequate pocket of fluid in the left chest. The area was then prepped and draped in the normal sterile fashion. 1% Lidocaine was used for local anesthesia. Under ultrasound guidance a Safe-T-Centesis catheter was introduced. Thoracentesis was performed. The catheter was  removed and a dressing applied. FINDINGS: A total of approximately 460 cc of hazy, amber fluid was removed. Samples were sent to the laboratory as requested by the clinical team. The pleural fluid collection was extensively multiloculated by today's US imaging. Only the above amount could be removed at this time. Recommend TCTS consult if clinical status does not improve. IMPRESSION: Successful ultrasound guided diagnostic and therapeutic left thoracentesis yielding 460 cc of pleural fluid. Read by: Jeananne Rama, PA-C Electronically Signed   By: Malachy Moan M.D.   On: 05/10/2016 11:19        Scheduled Meds: . antiseptic oral rinse  7 mL Mouth Rinse BID  . atorvastatin  10 mg Oral Daily  . heparin  5,000 Units Subcutaneous Q8H  . piperacillin-tazobactam (ZOSYN)  IV  3.375 g Intravenous Q8H   Continuous Infusions: . 0.9 % NaCl with KCl 20 mEq / L 70 mL/hr at 05/11/16 0525     LOS: 2 days    Time spent: 35 minutes    Elliot Cousin, MD [facility] Pager [phone]   If 7PM-7AM, please contact night-coverage [email or web address] Password [email or web address] 05/11/2016, 11:15 AM

## 2016-05-12 DIAGNOSIS — F17201 Nicotine dependence, unspecified, in remission: Secondary | ICD-10-CM

## 2016-05-12 DIAGNOSIS — J90 Pleural effusion, not elsewhere classified: Secondary | ICD-10-CM

## 2016-05-12 DIAGNOSIS — J869 Pyothorax without fistula: Secondary | ICD-10-CM

## 2016-05-12 DIAGNOSIS — R030 Elevated blood-pressure reading, without diagnosis of hypertension: Secondary | ICD-10-CM

## 2016-05-12 LAB — MRSA PCR SCREENING: MRSA by PCR: NEGATIVE

## 2016-05-12 MED ORDER — ENOXAPARIN SODIUM 40 MG/0.4ML ~~LOC~~ SOLN
40.0000 mg | SUBCUTANEOUS | Status: DC
  Administered 2016-05-12 – 2016-05-17 (×5): 40 mg via SUBCUTANEOUS
  Filled 2016-05-12 (×5): qty 0.4

## 2016-05-12 MED ORDER — HYDROCODONE-HOMATROPINE 5-1.5 MG/5ML PO SYRP
5.0000 mL | ORAL_SOLUTION | Freq: Four times a day (QID) | ORAL | Status: DC | PRN
  Administered 2016-05-12 (×3): 5 mL via ORAL
  Filled 2016-05-12 (×3): qty 5

## 2016-05-12 MED ORDER — IRBESARTAN 150 MG PO TABS
150.0000 mg | ORAL_TABLET | Freq: Every day | ORAL | Status: DC
  Filled 2016-05-12: qty 1

## 2016-05-12 MED ORDER — AMLODIPINE BESYLATE-VALSARTAN 5-160 MG PO TABS: 1.0000 | ORAL_TABLET | Freq: Every day | ORAL | Status: DC

## 2016-05-12 MED ORDER — AMLODIPINE BESYLATE 5 MG PO TABS: 5.0000 mg | ORAL_TABLET | Freq: Every day | ORAL | Status: DC

## 2016-05-12 MED ORDER — DM-GUAIFENESIN ER 30-600 MG PO TB12
1.0000 | ORAL_TABLET | Freq: Two times a day (BID) | ORAL | Status: DC
  Administered 2016-05-12 – 2016-05-17 (×11): 1 via ORAL
  Filled 2016-05-12 (×11): qty 1

## 2016-05-12 MED ORDER — VANCOMYCIN HCL 1000 MG IV SOLR
1000.0000 mg | INTRAVENOUS | Status: AC
  Administered 2016-05-13: 1000 mg via INTRAVENOUS
  Filled 2016-05-12 (×2): qty 1000

## 2016-05-12 NOTE — Progress Notes (Addendum)
PROGRESS NOTE                                                                                                                                                                                                             Patient Demographics:    Allen Becker, is a 50 y.o. male, DOB - 01/25/1966, VWU:[id number]  Admit date - 05/08/2016   Admitting Physician Ripudeep Jenna LuoK Rai, MD  Outpatient Primary MD for the patient is No primary care provider on file.  LOS - 3  Outpatient Specialists: none  Chief Complaint  Patient presents with  . Shortness of Breath       Brief Narrative      Subjective:   Has persistent cough with scanty whitish phlegm. No chest pain or worsening dyspnea.   Assessment  & Plan :    Principal Problem:   Lobar pneumonia with Loculated left-sided pleural effusion Patient started on Rocephin and azithromycin. Switched to vancomycin and Zosyn given persistent fever. -Continue O2 via nasal cannula. Has ongoing fever. Diagnostic and therapeutic thoracentesis done on 5/29 suggestive of exudate. Blood and pleural fluid cultures negative to date. Pleural fluid cytology pending. -[facility] cardioth[facility] evaluation and VATS procedure. Thoracic surgery consulted. (Was notified by the PA at he might be having VATS tomorrow) -Added antitussives.  Active Problems:  Essential hypertension On amlodipine and valsartan which was held due to low normal blood pressure. Blood pressure elevated today. I would resume.  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 Denies weight loss or hemoptysis.     Code Status : Full code  Family Communication  : son at bedside  Disposition Plan  : Home eventually pending inpatient workup and management  Barriers For Discharge : Active infection. Needs  VATS  Consults  :  Cardiothoracic surgery (Dr. Laneta SimmersBartle)  Procedures  : CT chest (at [facility])  DVT Prophylaxis  :  Lovenox -   Lab Results  Component Value Date   PLT 436* 05/11/2016    Antibiotics  :    Anti-infectives    Start     Dose/Rate Route Frequency Ordered Stop   05/11/16 1200  piperacillin-tazobactam (ZOSYN) IVPB 3.375 g     3.375 g 12.5 mL/hr over  240 Minutes Intravenous Every 8 hours 05/11/16 1115     05/10/16 0000  cefTRIAXone (ROCEPHIN) 1 g in dextrose 5 % 50 mL IVPB  Status:  Discontinued     1 g 100 mL/hr over 30 Minutes Intravenous Every 24 hours 05/09/16 0219 05/11/16 1016   05/09/16 0230  azithromycin (ZITHROMAX) 500 mg in dextrose 5 % 250 mL IVPB  Status:  Discontinued     500 mg 250 mL/hr over 60 Minutes Intravenous Every 24 hours 05/09/16 0219 05/09/16 0239   05/08/16 2330  azithromycin (ZITHROMAX) 500 mg in dextrose 5 % 250 mL IVPB  Status:  Discontinued     500 mg 250 mL/hr over 60 Minutes Intravenous Every 24 hours 05/08/16 2318 05/11/16 1016   05/08/16 2315  cefTRIAXone (ROCEPHIN) 2 g in dextrose 5 % 50 mL IVPB     2 g 100 mL/hr over 30 Minutes Intravenous  Once 05/08/16 2318 05/09/16 0006        Objective:   Filed Vitals:   05/11/16 2055 05/12/16 0000 05/12/16 0500 05/12/16 0700  BP: 140/99 108/73 120/88 116/71  Pulse: 110 93 86 83  Temp: 98.5 F (36.9 C)  100 F (37.8 C) 99.8 F (37.7 C)  TempSrc: Oral  Oral Oral  Resp: 26 31 24 23   Height: 5\' 6"  (1.676 m)     Weight: 75.8 kg (167 lb 1.7 oz)     SpO2: 95% 98%  96%    Wt Readings from Last 3 Encounters:  05/11/16 75.8 kg (167 lb 1.7 oz)     Intake/Output Summary (Last 24 hours) at 05/12/16 0847 Last data filed at 05/12/16 0501  Gross per 24 hour  Intake 2982.33 ml  Output    800 ml  Net 2182.33 ml     Physical Exam  Gen: not in distress HEENT: no pallor, moist mucosa, supple neck Chest: minimal breath sounds over lt lung. No added sounds CVS: N S1&S2, no murmurs, rubs  or gallop GI: soft, NT, ND, BS+ Musculoskeletal: warm, no edema CNS: AAOX3, non focal    Data Review:    CBC  Recent Labs Lab 05/08/16 2132 05/10/16 0841 05/11/16 1020  WBC 21.5* 19.1* 16.6*  HGB 14.3 12.7* 12.2*  HCT 42.4 37.6* 36.6*  PLT 530* 415* 436*  MCV 87.6 87.2 87.6  MCH 29.5 29.5 29.2  MCHC 33.7 33.8 33.3  RDW 12.4 12.5 12.4  LYMPHSABS 1.1  --   --   MONOABS 1.8*  --   --   EOSABS 0.1  --   --   BASOSABS 0.0  --   --     Chemistries   Recent Labs Lab 05/08/16 2132 05/10/16 0841 05/11/16 1020  NA 136 131* 135  K 4.0 3.8 3.7  CL 101 97* 100*  CO2 23 26 27   GLUCOSE 154* 122* 137*  BUN 19 17 17   CREATININE 0.91 0.67 0.66  CALCIUM 8.6* 8.2* 8.0*  AST  --  41  --   ALT  --  94*  --   ALKPHOS  --  147*  --   BILITOT  --  1.5*  --    ------------------------------------------------------------------------------------------------------------------ No results for input(s): CHOL, HDL, LDLCALC, TRIG, CHOLHDL, LDLDIRECT in the last 72 hours.  Lab Results  Component Value Date   HGBA1C 5.6 05/10/2016   ------------------------------------------------------------------------------------------------------------------ No results for input(s): TSH, T4TOTAL, T3FREE, THYROIDAB in the last 72 hours.  Invalid input(s): FREET3 ------------------------------------------------------------------------------------------------------------------ No results for input(s): VITAMINB12, FOLATE, FERRITIN, TIBC, IRON, RETICCTPCT  in the last 72 hours.  Coagulation profile  Recent Labs Lab 05/08/16 2138  INR 1.33    No results for input(s): DDIMER in the last 72 hours.  Cardiac Enzymes No results for input(s): CKMB, TROPONINI, MYOGLOBIN in the last 168 hours.  Invalid input(s): CK ------------------------------------------------------------------------------------------------------------------ No results found for: BNP  Inpatient Medications  Scheduled Meds: .  antiseptic oral rinse  7 mL Mouth Rinse BID  . atorvastatin  10 mg Oral Daily  . heparin  5,000 Units Subcutaneous Q8H  . piperacillin-tazobactam (ZOSYN)  IV  3.375 g Intravenous Q8H   Continuous Infusions: . 0.9 % NaCl with KCl 20 mEq / L 75 mL/hr at 05/12/16 0523   PRN Meds:.acetaminophen **OR** acetaminophen, morphine injection, ondansetron **OR** ondansetron (ZOFRAN) IV  Micro Results Recent Results (from the past 240 hour(s))  Culture, blood (Routine X 2) w Reflex to ID Panel     Status: None (Preliminary result)   Collection Time: 05/10/16  8:41 AM  Result Value Ref Range Status   Specimen Description BLOOD RIGHT ANTECUBITAL  Final   Special Requests BOTTLES DRAWN AEROBIC AND ANAEROBIC 12CC EACH  Final   Culture NO GROWTH 1 DAY  Final   Report Status PENDING  Incomplete  MRSA PCR Screening     Status: None   Collection Time: 05/10/16  8:45 AM  Result Value Ref Range Status   MRSA by PCR NEGATIVE NEGATIVE Final    Comment:        The GeneXpert MRSA Assay (FDA approved for NASAL specimens only), is one component of a comprehensive MRSA colonization surveillance program. It is not intended to diagnose MRSA infection nor to guide or monitor treatment for MRSA infections.   Culture, blood (Routine X 2) w Reflex to ID Panel     Status: None (Preliminary result)   Collection Time: 05/10/16  8:46 AM  Result Value Ref Range Status   Specimen Description BLOOD LEFT ANTECUBITAL  Final   Special Requests BOTTLES DRAWN AEROBIC AND ANAEROBIC 12CC EACH  Final   Culture NO GROWTH 1 DAY  Final   Report Status PENDING  Incomplete  Culture, body fluid-bottle     Status: None (Preliminary result)   Collection Time: 05/10/16 11:28 AM  Result Value Ref Range Status   Specimen Description FLUID PLEURAL LEFT  Final   Special Requests NONE  Final   Culture   Final    NO GROWTH < 24 HOURS Performed at [facility]    Report Status PENDING  Incomplete  Gram stain     Status: None    Collection Time: 05/10/16 11:28 AM  Result Value Ref Range Status   Specimen Description FLUID PLEURAL LEFT  Final   Special Requests NONE  Final   Gram Stain   Final    FEW WBC PRESENT, PREDOMINANTLY MONONUCLEAR NO ORGANISMS SEEN Performed at [facility]    Report Status 05/10/2016 FINAL  Final  MRSA PCR Screening     Status: None   Collection Time: 05/11/16  8:41 PM  Result Value Ref Range Status   MRSA by PCR NEGATIVE NEGATIVE Final    Comment:        The GeneXpert MRSA Assay (FDA approved for NASAL specimens only), is one component of a comprehensive MRSA colonization surveillance program. It is not intended to diagnose MRSA infection nor to guide or monitor treatment for MRSA infections.     Radiology Reports Dg Chest 1 View  05/10/2016  CLINICAL DATA:  Status  post left-sided thoracentesis. EXAM: CHEST 1 VIEW COMPARISON:  CT of 05/08/2016 FINDINGS: Minimal tracheal deviation right. Normal heart size. Moderate left-sided loculated pleural effusion is similar. No pneumothorax. Clear right lung. Near complete opacification of the inferior left hemi thorax is not significantly changed. IMPRESSION: No pneumothorax. Similar moderate loculated left pleural effusion with adjacent collapse/ consolidation. Electronically Signed   By: Jeronimo Greaves M.D.   On: 05/10/2016 11:39   Dg Chest 2 View  05/08/2016  CLINICAL DATA:  Initial evaluation for acute cough, wheezing. EXAM: CHEST  2 VIEW COMPARISON:  None. FINDINGS: Cardiac silhouette grossly within normal limits, although the left heart border is obscured on this exam. Mediastinal silhouette within normal limits. Tracheal air column mildly deviated to the right. Lungs normally inflated. AE large left pleural effusion is present. Associated left basilar atelectasis and/ or consolidation. Superimposed mass not excluded. Left lung apex is aerated. Right lung is clear. No pneumothorax. No acute osseous abnormality. IMPRESSION: Large  left pleural effusion with associated left lower lobe atelectasis and/ or consolidation. Superimposed mass not excluded. Further evaluation with cross-sectional imaging is suggested to further delineate these findings. Electronically Signed   By: Rise Mu M.D.   On: 05/08/2016 22:25   Ct Chest Wo Contrast  05/09/2016  CLINICAL DATA:  Shortness of breath. Recent diagnosis of pneumonia. Abnormal chest x-ray. EXAM: CT CHEST WITHOUT CONTRAST TECHNIQUE: Multidetector CT imaging of the chest was performed following the standard protocol without IV contrast. COMPARISON:  Chest radiographs earlier this day. FINDINGS: Large partially loculated left pleural effusion measuring simple fluid density. This causes complete collapse/ atelectasis of the left lower lobe and moderate left upper lobe atelectasis. Tiny, 3 mm, pleural-based nodule was seen in the aerated left upper lobe posteriorly. There is mild rightward mediastinal shift secondary to large pleural effusion. Scattered air bronchograms are seen. No evidence of endobronchial lesion. No focal consolidation in the right lung. No right pleural effusion. No evidence pulmonary edema. Small mediastinal lymph nodes are likely reactive. Limited assessment for hilar adenopathy given lack of contrast. The thoracic aorta is normal in caliber. No acute abnormality in the included upper abdomen. No adrenal nodule. There are no acute or suspicious osseous abnormalities. IMPRESSION: 1. Large partially loculated left pleural effusion. This cause of complete collapse/atelectasis of the left lower lobe and moderate left upper lobe atelectasis. This limits assessment of the underlying lung parenchyma. Underlying pneumonia or pulmonary mass cannot be assessed. Recommend follow-up imaging after drainage or resolution of pleural fluid. 2. No focal abnormality in the right lung. Electronically Signed   By: Rubye Oaks M.D.   On: 05/09/2016 00:09   US Thoracentesis Asp  Pleural Space W/img Guide  05/10/2016  INDICATION: Smoker, dyspnea, cough, pneumonia, left pleural effusion. Request made for diagnostic and therapeutic left thoracentesis. EXAM: ULTRASOUND GUIDED DIAGNOSTIC AND THERAPEUTIC LEFT THORACENTESIS MEDICATIONS: None. COMPLICATIONS: None immediate. PROCEDURE: An ultrasound guided thoracentesis was thoroughly discussed with the patient and questions answered. The benefits, risks, alternatives and complications were also discussed. The patient understands and wishes to proceed with the procedure. Written consent was obtained. Ultrasound was performed to localize and mark an adequate pocket of fluid in the left chest. The area was then prepped and draped in the normal sterile fashion. 1% Lidocaine was used for local anesthesia. Under ultrasound guidance a Safe-T-Centesis catheter was introduced. Thoracentesis was performed. The catheter was removed and a dressing applied. FINDINGS: A total of approximately 460 cc of hazy, amber fluid was removed. Samples were sent to  the laboratory as requested by the clinical team. The pleural fluid collection was extensively multiloculated by today's US imaging. Only the above amount could be removed at this time. Recommend TCTS consult if clinical status does not improve. IMPRESSION: Successful ultrasound guided diagnostic and therapeutic left thoracentesis yielding 460 cc of pleural fluid. Read by: Jeananne Rama, PA-C Electronically Signed   By: Malachy Moan M.D.   On: 05/10/2016 11:19    Time Spent in minutes  25   Eddie North M.D on 05/12/2016 at 8:47 AM  Between 7am to 7pm - Pager - [phone]  After 7pm go to [email or web address] - password [facility]  [facility] -  Office  [phone]

## 2016-05-12 NOTE — Consult Note (Signed)
    [address]       [place] [address]             [phone]      Cardiothoracic Surgery Consultation   Reason for Consult: Left empyema Referring Physician: Dr. N. Dhungel  Allen Becker is an 50 y.o. male.  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 He was seen at an urgent care and diagnosed with pneumonia and treated with oral antibiotics (Levaquin). He continued to work but continued to feel poorly and developed subjective fever and chills so he came to the [facility]. He had a leukocytosis of 21.5. A CXR showed a large left pleural effusion and CT showed a large loculated left pleural effusion with compressive atelectasis of the left lung. He had a left thoracentesis removing 460 cc of amber fluid that has not grown anything yet.   Past Medical History  Diagnosis Date  . Hypertension   . Allergic rhinitis   . Loculated pleural effusion 05/10/2016    History reviewed. No pertinent past surgical history.  History reviewed. No pertinent family history.  Social History:  reports that he has quit smoking. He does not have any smokeless tobacco history on file. He reports that he does not drink alcohol or use illicit drugs.  Allergies: No Known Allergies  Medications:  I have reviewed the patient's current medications. Prior to Admission:  Prescriptions prior to admission  Medication Sig Dispense Refill Last Dose  . amLODipine-valsartan (EXFORGE) 5-160 MG tablet Take 1 tablet by mouth daily.  4 05/08/2016 at Unknown time  . atorvastatin (LIPITOR) 10 MG tablet Take 10 mg by mouth daily.   05/08/2016 at Unknown time  . HYDROcodone-homatropine (HYCODAN) 5-1.5 MG/5ML syrup Take 5 mLs by mouth at bedtime as needed.   05/08/2016 at Unknown time  . ibuprofen (ADVIL,MOTRIN) 200 MG tablet Take 200 mg by mouth every 6 (six)  hours as needed for mild pain or moderate pain.   05/08/2016 at Unknown time  . levofloxacin (LEVAQUIN) 750 MG tablet Take 750 mg by mouth daily. 7 day course starting on 05/06/2016   05/08/2016 at Unknown time   Scheduled: . [START ON 05/13/2016] amLODipine  5 mg Oral Daily   And  . [START ON 05/13/2016] irbesartan  150 mg Oral Daily  . antiseptic oral rinse  7 mL Mouth Rinse BID  . atorvastatin  10 mg Oral Daily  . dextromethorphan-guaiFENesin  1 tablet Oral BID  . enoxaparin (LOVENOX) injection  40 mg Subcutaneous Q24H  . piperacillin-tazobactam (ZOSYN)  IV  3.375 g Intravenous Q8H  . vancomycin  1,000 mg Intravenous To OR   Continuous: . 0.9 % NaCl with KCl 20 mEq / L 75 mL/hr at 05/12/16 0523   PRN:acetaminophen **OR** acetaminophen, HYDROcodone-homatropine, morphine injection, ondansetron **OR** ondansetron (ZOFRAN) IV Anti-infectives    Start     Dose/Rate Route Frequency Ordered Stop   05/12/16 1245  vancomycin (VANCOCIN) 1,000 mg in sodium chloride 0.9 % 250 mL IVPB     1,000 mg 250 mL/hr over 60 Minutes Intravenous To Surgery 05/12/16 1236 05/13/16 1245   05/11/16 1200  piperacillin-tazobactam (ZOSYN) IVPB 3.375 g     3.375 g 12.5 mL/hr over 240 Minutes Intravenous Every 8 hours 05/11/16 1115     05/10/16 0000  cefTRIAXone (  ROCEPHIN) 1 g in dextrose 5 % 50 mL IVPB  Status:  Discontinued     1 g 100 mL/hr over 30 Minutes Intravenous Every 24 hours 05/09/16 0219 05/11/16 1016   05/09/16 0230  azithromycin (ZITHROMAX) 500 mg in dextrose 5 % 250 mL IVPB  Status:  Discontinued     500 mg 250 mL/hr over 60 Minutes Intravenous Every 24 hours 05/09/16 0219 05/09/16 0239   05/08/16 2330  azithromycin (ZITHROMAX) 500 mg in dextrose 5 % 250 mL IVPB  Status:  Discontinued     500 mg 250 mL/hr over 60 Minutes Intravenous Every 24 hours 05/08/16 2318 05/11/16 1016   05/08/16 2315  cefTRIAXone (ROCEPHIN) 2 g in dextrose 5 % 50 mL IVPB     2 g 100 mL/hr over 30 Minutes Intravenous  Once  05/08/16 2318 05/09/16 0006      Results for orders placed or performed during the hospital encounter of 05/08/16 (from the past 48 hour(s))  Basic metabolic panel     Status: Abnormal   Collection Time: 05/11/16 10:20 AM  Result Value Ref Range   Sodium 135 135 - 145 mmol/L   Potassium 3.7 3.5 - 5.1 mmol/L   Chloride 100 (L) 101 - 111 mmol/L   CO2 27 22 - 32 mmol/L   Glucose, Bld 137 (H) 65 - 99 mg/dL   BUN 17 6 - 20 mg/dL   Creatinine, Ser 0.66 0.61 - 1.24 mg/dL   Calcium 8.0 (L) 8.9 - 10.3 mg/dL   GFR calc non Af Amer >60 >60 mL/min   GFR calc Af Amer >60 >60 mL/min    Comment: (NOTE) The eGFR has been calculated using the CKD EPI equation. This calculation has not been validated in all clinical situations. eGFR's persistently <60 mL/min signify possible Chronic Kidney Disease.    Anion gap 8 5 - 15  CBC     Status: Abnormal   Collection Time: 05/11/16 10:20 AM  Result Value Ref Range   WBC 16.6 (H) 4.0 - 10.5 K/uL   RBC 4.18 (L) 4.22 - 5.81 MIL/uL   Hemoglobin 12.2 (L) 13.0 - 17.0 g/dL   HCT 36.6 (L) 39.0 - 52.0 %   MCV 87.6 78.0 - 100.0 fL   MCH 29.2 26.0 - 34.0 pg   MCHC 33.3 30.0 - 36.0 g/dL   RDW 12.4 11.5 - 15.5 %   Platelets 436 (H) 150 - 400 K/uL  MRSA PCR Screening     Status: None   Collection Time: 05/11/16  8:41 PM  Result Value Ref Range   MRSA by PCR NEGATIVE NEGATIVE    Comment:        The GeneXpert MRSA Assay (FDA approved for NASAL specimens only), is one component of a comprehensive MRSA colonization surveillance program. It is not intended to diagnose MRSA infection nor to guide or monitor treatment for MRSA infections.     No results found.  Review of Systems  Constitutional: Positive for fever, chills, weight loss and malaise/fatigue.       Poor appetite  HENT: Negative.   Eyes: Negative.   Respiratory: Positive for cough, sputum production and shortness of breath. Negative for hemoptysis.   Cardiovascular: Negative for orthopnea,  leg swelling and PND.  Gastrointestinal: Negative.   Genitourinary: Negative.   Musculoskeletal: Negative.   Skin: Negative.   Neurological: Negative.   Endo/Heme/Allergies: Negative.   Psychiatric/Behavioral: Negative.    Blood pressure 134/101, pulse 88, temperature 100.1 F (37.8 C), temperature source  Oral, resp. rate 28, height 5' 6" (1.676 m), weight 75.8 kg (167 lb 1.7 oz), SpO2 98 %. Physical Exam  Constitutional: He is oriented to person, place, and time. He appears well-developed and well-nourished.  Looks ill  HENT:  Head: Normocephalic and atraumatic.  Mouth/Throat: Oropharynx is clear and moist.  Eyes: Conjunctivae and EOM are normal. Pupils are equal, round, and reactive to light.  Neck: Normal range of motion. Neck supple. No thyromegaly present.  Cardiovascular: Normal rate, regular rhythm, normal heart sounds and intact distal pulses.   No murmur heard. Respiratory: Effort normal. No respiratory distress. He exhibits no tenderness.  Markedly decreased breath sounds on the left  GI: Soft. Bowel sounds are normal. He exhibits no distension and no mass. There is no tenderness.  Musculoskeletal: Normal range of motion. He exhibits no edema.  Lymphadenopathy:    He has no cervical adenopathy.  Neurological: He is alert and oriented to person, place, and time.  Skin: Skin is warm and dry.  Psychiatric: He has a normal mood and affect.   CLINICAL DATA: Shortness of breath. Recent diagnosis of pneumonia. Abnormal chest x-ray.  EXAM: CT CHEST WITHOUT CONTRAST  TECHNIQUE: Multidetector CT imaging of the chest was performed following the standard protocol without IV contrast.  COMPARISON: Chest radiographs earlier this day.  FINDINGS: Large partially loculated left pleural effusion measuring simple fluid density. This causes complete collapse/ atelectasis of the left lower lobe and moderate left upper lobe atelectasis. Tiny, 3 mm, pleural-based nodule was  seen in the aerated left upper lobe posteriorly. There is mild rightward mediastinal shift secondary to large pleural effusion. Scattered air bronchograms are seen. No evidence of endobronchial lesion.  No focal consolidation in the right lung. No right pleural effusion. No evidence pulmonary edema.  Small mediastinal lymph nodes are likely reactive. Limited assessment for hilar adenopathy given lack of contrast. The thoracic aorta is normal in caliber.  No acute abnormality in the included upper abdomen. No adrenal nodule.  There are no acute or suspicious osseous abnormalities.  IMPRESSION: 1. Large partially loculated left pleural effusion. This cause of complete collapse/atelectasis of the left lower lobe and moderate left upper lobe atelectasis. This limits assessment of the underlying lung parenchyma. Underlying pneumonia or pulmonary mass cannot be assessed. Recommend follow-up imaging after drainage or resolution of pleural fluid. 2. No focal abnormality in the right lung.   Electronically Signed  By: Jeb Levering M.D.  On: 05/09/2016 00:09   Assessment/Plan:  I have personally reviewed his medical history and CT scan, examined the patient. He has a large multiloculated left pleural effusion that is most likely an empyema with leukocytosis, fever. He has near complete collapse of the left lung so it is not possible to determine if there is underlying pneumonia or lung mass. I think the best treatment is surgical drainage with VATS or thoracotomy. I discussed the procedure with the patient including alternatives, benefits, and risks including but not limited to bleeding, blood transfusion, infection, prolonged air leak and he understands and agrees to proceed. I will do it tomorrow morning.  Gaye Pollack 05/12/2016, 4:57 PM

## 2016-05-12 NOTE — Anesthesia Preprocedure Evaluation (Addendum)
Anesthesia Evaluation  Patient identified by MRN, date of birth, ID band Patient awake    Reviewed: Allergy & Precautions, NPO status , Patient's Chart, lab work & pertinent test results  Airway Mallampati: II  TM Distance: >3 FB Neck ROM: Full    Dental no notable dental hx.    Pulmonary pneumonia, former smoker,    Pulmonary exam normal breath sounds clear to auscultation       Cardiovascular hypertension, Pt. on medications Normal cardiovascular exam Rhythm:Regular Rate:Normal     Neuro/Psych negative neurological ROS  negative psych ROS   GI/Hepatic negative GI ROS, Neg liver ROS,   Endo/Other  negative endocrine ROS  Renal/GU negative Renal ROS     Musculoskeletal negative musculoskeletal ROS (+)   Abdominal   Peds  Hematology negative hematology ROS (+)   Anesthesia Other Findings   Reproductive/Obstetrics                            Anesthesia Physical Anesthesia Plan  ASA: II  Anesthesia Plan: General   Post-op Pain Management:    Induction: Intravenous  Airway Management Planned: Double Lumen EBT  Additional Equipment:   Intra-op Plan:   Post-operative Plan: Extubation in OR  Informed Consent: I have reviewed the patients History and Physical, chart, labs and discussed the procedure including the risks, benefits and alternatives for the proposed anesthesia with the patient or authorized representative who has indicated his/her understanding and acceptance.   Dental advisory given  Plan Discussed with: CRNA  Anesthesia Plan Comments: (2 x PIV (16g or greater))      Anesthesia Quick Evaluation

## 2016-05-13 ENCOUNTER — Inpatient Hospital Stay (HOSPITAL_COMMUNITY): Payer: BLUE CROSS/BLUE SHIELD | Admitting: Anesthesiology

## 2016-05-13 ENCOUNTER — Encounter (HOSPITAL_COMMUNITY)
Admission: EM | Disposition: A | Discharge status: Home / Self Care | Payer: Self-pay | Source: Home / Self Care | Attending: Surgery

## 2016-05-13 ENCOUNTER — Encounter (HOSPITAL_COMMUNITY): Payer: Self-pay | Admitting: Certified Registered"

## 2016-05-13 ENCOUNTER — Inpatient Hospital Stay (HOSPITAL_COMMUNITY): Payer: BLUE CROSS/BLUE SHIELD

## 2016-05-13 DIAGNOSIS — J869 Pyothorax without fistula: Secondary | ICD-10-CM

## 2016-05-13 DIAGNOSIS — J852 Abscess of lung without pneumonia: Secondary | ICD-10-CM

## 2016-05-13 DIAGNOSIS — I10 Essential (primary) hypertension: Secondary | ICD-10-CM

## 2016-05-13 DIAGNOSIS — J189 Pneumonia, unspecified organism: Secondary | ICD-10-CM

## 2016-05-13 DIAGNOSIS — J948 Other specified pleural conditions: Secondary | ICD-10-CM

## 2016-05-13 HISTORY — PX: PLEURAL EFFUSION DRAINAGE: SHX5099

## 2016-05-13 HISTORY — PX: VIDEO ASSISTED THORACOSCOPY (VATS)/DECORTICATION: SHX6171

## 2016-05-13 LAB — CBC
HEMATOCRIT: 33.9 % — AB (ref 39.0–52.0)
HEMOGLOBIN: 10.7 g/dL — AB (ref 13.0–17.0)
MCH: 27.6 pg (ref 26.0–34.0)
MCHC: 31.6 g/dL (ref 30.0–36.0)
MCV: 87.4 fL (ref 78.0–100.0)
Platelets: 458 10*3/uL — ABNORMAL HIGH (ref 150–400)
RBC: 3.88 MIL/uL — ABNORMAL LOW (ref 4.22–5.81)
RDW: 12.5 % (ref 11.5–15.5)
WBC: 15.2 10*3/uL — ABNORMAL HIGH (ref 4.0–10.5)

## 2016-05-13 LAB — GLUCOSE, CAPILLARY
GLUCOSE-CAPILLARY: 101 mg/dL — AB (ref 65–99)
Glucose-Capillary: 142 mg/dL — ABNORMAL HIGH (ref 65–99)

## 2016-05-13 LAB — GRAM STAIN

## 2016-05-13 LAB — SURGICAL PCR SCREEN
MRSA, PCR: NEGATIVE
STAPHYLOCOCCUS AUREUS: NEGATIVE

## 2016-05-13 SURGERY — VIDEO ASSISTED THORACOSCOPY (VATS)/DECORTICATION
Anesthesia: General | Site: Chest | Laterality: Left

## 2016-05-13 MED ORDER — SUGAMMADEX SODIUM 200 MG/2ML IV SOLN
INTRAVENOUS | Status: DC | PRN
  Administered 2016-05-13: 200 mg via INTRAVENOUS

## 2016-05-13 MED ORDER — HYDROMORPHONE HCL 1 MG/ML IJ SOLN
0.5000 mg | INTRAMUSCULAR | Status: AC | PRN
  Administered 2016-05-13 (×4): 0.5 mg via INTRAVENOUS

## 2016-05-13 MED ORDER — ONDANSETRON HCL 4 MG/2ML IJ SOLN: 4.0000 mg | Freq: Four times a day (QID) | INTRAMUSCULAR | Status: DC | PRN

## 2016-05-13 MED ORDER — FENTANYL CITRATE (PF) 100 MCG/2ML IJ SOLN
INTRAMUSCULAR | Status: DC | PRN
  Administered 2016-05-13 (×3): 50 ug via INTRAVENOUS
  Administered 2016-05-13: 150 ug via INTRAVENOUS
  Administered 2016-05-13 (×2): 100 ug via INTRAVENOUS

## 2016-05-13 MED ORDER — DIPHENHYDRAMINE HCL 12.5 MG/5ML PO ELIX
12.5000 mg | ORAL_SOLUTION | Freq: Four times a day (QID) | ORAL | Status: DC | PRN
  Filled 2016-05-13: qty 5

## 2016-05-13 MED ORDER — ONDANSETRON HCL 4 MG/2ML IJ SOLN
4.0000 mg | Freq: Four times a day (QID) | INTRAMUSCULAR | Status: DC | PRN
  Administered 2016-05-14 – 2016-05-15 (×2): 4 mg via INTRAVENOUS
  Filled 2016-05-13 (×3): qty 2

## 2016-05-13 MED ORDER — LIDOCAINE 2% (20 MG/ML) 5 ML SYRINGE
INTRAMUSCULAR | Status: AC
  Filled 2016-05-13: qty 5

## 2016-05-13 MED ORDER — PROPOFOL 10 MG/ML IV BOLUS
INTRAVENOUS | Status: DC | PRN
  Administered 2016-05-13: 60 mg via INTRAVENOUS
  Administered 2016-05-13: 140 mg via INTRAVENOUS

## 2016-05-13 MED ORDER — ACETAMINOPHEN 160 MG/5ML PO SOLN
1000.0000 mg | Freq: Four times a day (QID) | ORAL | Status: DC
Start: 2016-05-13 — End: 2016-05-18

## 2016-05-13 MED ORDER — LIDOCAINE HCL (CARDIAC) 20 MG/ML IV SOLN
INTRAVENOUS | Status: DC | PRN
  Administered 2016-05-13: 40 mg via INTRAVENOUS
  Administered 2016-05-13: 60 mg via INTRAVENOUS

## 2016-05-13 MED ORDER — INSULIN ASPART 100 UNIT/ML ~~LOC~~ SOLN
0.0000 [IU] | SUBCUTANEOUS | Status: DC
  Administered 2016-05-13 – 2016-05-14 (×3): 2 [IU] via SUBCUTANEOUS
  Administered 2016-05-14: 4 [IU] via SUBCUTANEOUS
  Administered 2016-05-14 – 2016-05-15 (×3): 2 [IU] via SUBCUTANEOUS

## 2016-05-13 MED ORDER — LEVALBUTEROL HCL 0.63 MG/3ML IN NEBU
0.6300 mg | INHALATION_SOLUTION | Freq: Four times a day (QID) | RESPIRATORY_TRACT | Status: DC
  Administered 2016-05-13 – 2016-05-16 (×12): 0.63 mg via RESPIRATORY_TRACT
  Filled 2016-05-13 (×12): qty 3

## 2016-05-13 MED ORDER — SUGAMMADEX SODIUM 200 MG/2ML IV SOLN
INTRAVENOUS | Status: AC
  Filled 2016-05-13: qty 2

## 2016-05-13 MED ORDER — VANCOMYCIN HCL IN DEXTROSE 1-5 GM/200ML-% IV SOLN
INTRAVENOUS | Status: AC
  Administered 2016-05-13: 09:00:00
  Filled 2016-05-13: qty 200

## 2016-05-13 MED ORDER — ACETAMINOPHEN 500 MG PO TABS
1000.0000 mg | ORAL_TABLET | Freq: Four times a day (QID) | ORAL | Status: DC
Start: 2016-05-13 — End: 2016-05-18
  Administered 2016-05-13 – 2016-05-18 (×19): 1000 mg via ORAL
  Filled 2016-05-13 (×19): qty 2

## 2016-05-13 MED ORDER — MEPERIDINE HCL 25 MG/ML IJ SOLN: 6.2500 mg | INTRAMUSCULAR | Status: DC | PRN

## 2016-05-13 MED ORDER — ONDANSETRON HCL 4 MG/2ML IJ SOLN
INTRAMUSCULAR | Status: AC
  Filled 2016-05-13: qty 2

## 2016-05-13 MED ORDER — HYDROMORPHONE HCL 1 MG/ML IJ SOLN
INTRAMUSCULAR | Status: AC
  Administered 2016-05-13: 1 mg via INTRAVENOUS
  Filled 2016-05-13: qty 2

## 2016-05-13 MED ORDER — FENTANYL 40 MCG/ML IV SOLN
INTRAVENOUS | Status: AC
  Administered 2016-05-13: 15 ug
  Filled 2016-05-13: qty 25

## 2016-05-13 MED ORDER — ROCURONIUM BROMIDE 100 MG/10ML IV SOLN
INTRAVENOUS | Status: DC | PRN
  Administered 2016-05-13: 30 mg via INTRAVENOUS
  Administered 2016-05-13: 50 mg via INTRAVENOUS
  Administered 2016-05-13: 20 mg via INTRAVENOUS

## 2016-05-13 MED ORDER — NALOXONE HCL 0.4 MG/ML IJ SOLN
0.4000 mg | INTRAMUSCULAR | Status: DC | PRN
  Filled 2016-05-13: qty 1

## 2016-05-13 MED ORDER — PROPOFOL 10 MG/ML IV BOLUS
INTRAVENOUS | Status: AC
  Filled 2016-05-13: qty 40

## 2016-05-13 MED ORDER — MIDAZOLAM HCL 2 MG/2ML IJ SOLN
INTRAMUSCULAR | Status: AC
  Filled 2016-05-13: qty 2

## 2016-05-13 MED ORDER — BISACODYL 5 MG PO TBEC
10.0000 mg | DELAYED_RELEASE_TABLET | Freq: Every day | ORAL | Status: DC
  Administered 2016-05-13 – 2016-05-16 (×4): 10 mg via ORAL
  Filled 2016-05-13 (×5): qty 2

## 2016-05-13 MED ORDER — FENTANYL CITRATE (PF) 250 MCG/5ML IJ SOLN
INTRAMUSCULAR | Status: AC
  Filled 2016-05-13: qty 5

## 2016-05-13 MED ORDER — VANCOMYCIN HCL IN DEXTROSE 1-5 GM/200ML-% IV SOLN
1000.0000 mg | Freq: Two times a day (BID) | INTRAVENOUS | Status: AC
  Administered 2016-05-13: 1000 mg via INTRAVENOUS
  Filled 2016-05-13: qty 200

## 2016-05-13 MED ORDER — SODIUM CHLORIDE 0.9% FLUSH: 9.0000 mL | INTRAVENOUS | Status: DC | PRN

## 2016-05-13 MED ORDER — ROCURONIUM BROMIDE 50 MG/5ML IV SOLN
INTRAVENOUS | Status: AC
  Filled 2016-05-13: qty 2

## 2016-05-13 MED ORDER — LACTATED RINGERS IV SOLN
INTRAVENOUS | Status: DC | PRN
  Administered 2016-05-13: 09:00:00 via INTRAVENOUS

## 2016-05-13 MED ORDER — MIDAZOLAM HCL 5 MG/5ML IJ SOLN
INTRAMUSCULAR | Status: DC | PRN
  Administered 2016-05-13: 2 mg via INTRAVENOUS

## 2016-05-13 MED ORDER — HYDROMORPHONE HCL 1 MG/ML IJ SOLN
INTRAMUSCULAR | Status: AC
  Administered 2016-05-13: 15:00:00
  Filled 2016-05-13: qty 1

## 2016-05-13 MED ORDER — DIPHENHYDRAMINE HCL 50 MG/ML IJ SOLN
12.5000 mg | Freq: Four times a day (QID) | INTRAMUSCULAR | Status: DC | PRN
  Filled 2016-05-13: qty 0.25

## 2016-05-13 MED ORDER — POTASSIUM CHLORIDE 10 MEQ/50ML IV SOLN
10.0000 meq | Freq: Every day | INTRAVENOUS | Status: DC | PRN
  Filled 2016-05-13: qty 50

## 2016-05-13 MED ORDER — METOCLOPRAMIDE HCL 5 MG/ML IJ SOLN
10.0000 mg | Freq: Four times a day (QID) | INTRAMUSCULAR | Status: AC
  Administered 2016-05-13 – 2016-05-14 (×4): 10 mg via INTRAVENOUS
  Filled 2016-05-13 (×4): qty 2

## 2016-05-13 MED ORDER — TRAMADOL HCL 50 MG PO TABS: 50.0000 mg | ORAL_TABLET | Freq: Four times a day (QID) | ORAL | Status: DC | PRN

## 2016-05-13 MED ORDER — KETOROLAC TROMETHAMINE 15 MG/ML IJ SOLN
15.0000 mg | Freq: Four times a day (QID) | INTRAMUSCULAR | Status: DC | PRN
  Administered 2016-05-13 – 2016-05-14 (×4): 15 mg via INTRAVENOUS
  Filled 2016-05-13 (×4): qty 1

## 2016-05-13 MED ORDER — SENNOSIDES-DOCUSATE SODIUM 8.6-50 MG PO TABS
1.0000 | ORAL_TABLET | Freq: Every day | ORAL | Status: DC
  Administered 2016-05-13 – 2016-05-17 (×5): 1 via ORAL
  Filled 2016-05-13 (×6): qty 1

## 2016-05-13 MED ORDER — DEXTROSE IN LACTATED RINGERS 5 % IV SOLN
INTRAVENOUS | Status: DC
  Administered 2016-05-13 – 2016-05-14 (×3): via INTRAVENOUS
  Administered 2016-05-15 – 2016-05-16 (×3): 100 mL/h via INTRAVENOUS

## 2016-05-13 MED ORDER — FENTANYL 40 MCG/ML IV SOLN
INTRAVENOUS | Status: DC
  Administered 2016-05-13: 16:00:00 via INTRAVENOUS
  Administered 2016-05-13: 180 ug via INTRAVENOUS
  Administered 2016-05-14: 255 ug via INTRAVENOUS
  Administered 2016-05-14: 30 ug via INTRAVENOUS
  Administered 2016-05-14: 225 ug via INTRAVENOUS
  Administered 2016-05-14 (×2): 75 ug via INTRAVENOUS
  Administered 2016-05-14: 90 ug via INTRAVENOUS
  Administered 2016-05-15: 195 ug via INTRAVENOUS
  Administered 2016-05-15: 255 ug via INTRAVENOUS
  Administered 2016-05-15: via INTRAVENOUS
  Administered 2016-05-15: 105 ug via INTRAVENOUS
  Administered 2016-05-15: 150 ug via INTRAVENOUS
  Administered 2016-05-15: 30 ug via INTRAVENOUS
  Administered 2016-05-15: 225 ug via INTRAVENOUS
  Administered 2016-05-16: 60 ug via INTRAVENOUS
  Administered 2016-05-16 (×2): 15 ug via INTRAVENOUS
  Filled 2016-05-13 (×2): qty 25

## 2016-05-13 MED ORDER — ONDANSETRON HCL 4 MG/2ML IJ SOLN
INTRAMUSCULAR | Status: DC | PRN
  Administered 2016-05-13: 4 mg via INTRAVENOUS

## 2016-05-13 MED ORDER — OXYCODONE HCL 5 MG PO TABS
5.0000 mg | ORAL_TABLET | ORAL | Status: DC | PRN
  Administered 2016-05-13: 5 mg via ORAL
  Administered 2016-05-14 – 2016-05-16 (×3): 10 mg via ORAL
  Filled 2016-05-13: qty 2
  Filled 2016-05-13: qty 1
  Filled 2016-05-13 (×2): qty 2

## 2016-05-13 MED ORDER — HYDROMORPHONE HCL 1 MG/ML IJ SOLN: 0.2500 mg | INTRAMUSCULAR | Status: DC | PRN

## 2016-05-13 MED ORDER — HYDROMORPHONE HCL 1 MG/ML IJ SOLN
INTRAMUSCULAR | Status: AC
  Filled 2016-05-13: qty 1

## 2016-05-13 MED ORDER — LACTATED RINGERS IV SOLN: INTRAVENOUS | Status: DC

## 2016-05-13 MED ORDER — 0.9 % SODIUM CHLORIDE (POUR BTL) OPTIME
TOPICAL | Status: DC | PRN
  Administered 2016-05-13: 2000 mL

## 2016-05-13 SURGICAL SUPPLY — 51 items
CANISTER SUCTION 2500CC (MISCELLANEOUS) ×3 IMPLANT
CLEANER TIP ELECTROSURG 2X2 (MISCELLANEOUS) ×3 IMPLANT
CONN ST 1/4X3/8  BEN (MISCELLANEOUS) ×4
CONN ST 1/4X3/8 BEN (MISCELLANEOUS) IMPLANT
CONN Y 3/8X3/8X3/8  BEN (MISCELLANEOUS) ×2
CONN Y 3/8X3/8X3/8 BEN (MISCELLANEOUS) IMPLANT
CONT SPEC 4OZ CLIKSEAL STRL BL (MISCELLANEOUS) ×6 IMPLANT
DRAIN CHANNEL 32F RND 10.7 FF (WOUND CARE) ×4 IMPLANT
DRAPE LAPAROSCOPIC ABDOMINAL (DRAPES) ×3 IMPLANT
ELECT BLADE 4.0 EZ CLEAN MEGAD (MISCELLANEOUS) ×3
ELECT REM PT RETURN 9FT ADLT (ELECTROSURGICAL) ×3
ELECTRODE BLDE 4.0 EZ CLN MEGD (MISCELLANEOUS) IMPLANT
ELECTRODE REM PT RTRN 9FT ADLT (ELECTROSURGICAL) ×1 IMPLANT
GAUZE SPONGE 4X4 12PLY STRL (GAUZE/BANDAGES/DRESSINGS) ×3 IMPLANT
GLOVE BIO SURGEON STRL SZ 6.5 (GLOVE) ×5 IMPLANT
GLOVE BIO SURGEONS STRL SZ 6.5 (GLOVE) ×3
GLOVE BIOGEL PI IND STRL 6.5 (GLOVE) IMPLANT
GLOVE BIOGEL PI INDICATOR 6.5 (GLOVE) ×2
GLOVE EUDERMIC 7 POWDERFREE (GLOVE) ×6 IMPLANT
GLOVE SURG SS PI 7.0 STRL IVOR (GLOVE) ×6 IMPLANT
GOWN STRL REUS W/ TWL LRG LVL3 (GOWN DISPOSABLE) ×4 IMPLANT
GOWN STRL REUS W/ TWL XL LVL3 (GOWN DISPOSABLE) IMPLANT
GOWN STRL REUS W/TWL LRG LVL3 (GOWN DISPOSABLE) ×3
GOWN STRL REUS W/TWL XL LVL3 (GOWN DISPOSABLE) ×9
KIT BASIN OR (CUSTOM PROCEDURE TRAY) ×3 IMPLANT
KIT ROOM TURNOVER OR (KITS) ×3 IMPLANT
KIT SUCTION CATH 14FR (SUCTIONS) ×1 IMPLANT
NS IRRIG 1000ML POUR BTL (IV SOLUTION) ×8 IMPLANT
PACK CHEST (CUSTOM PROCEDURE TRAY) ×3 IMPLANT
PAD ARMBOARD 7.5X6 YLW CONV (MISCELLANEOUS) ×6 IMPLANT
SOLUTION ANTI FOG 6CC (MISCELLANEOUS) ×2 IMPLANT
SPONGE GAUZE 4X4 12PLY STER LF (GAUZE/BANDAGES/DRESSINGS) ×2 IMPLANT
STAPLER VISISTAT 35W (STAPLE) ×2 IMPLANT
SUT SILK  1 MH (SUTURE) ×4
SUT SILK 1 MH (SUTURE) ×2 IMPLANT
SUT SILK 1 TIES 10X30 (SUTURE) ×2 IMPLANT
SUT VIC AB 1 CTX 36 (SUTURE) ×6
SUT VIC AB 1 CTX36XBRD ANBCTR (SUTURE) IMPLANT
SUT VIC AB 2-0 CTX 36 (SUTURE) ×2 IMPLANT
SUT VIC AB 3-0 X1 27 (SUTURE) ×2 IMPLANT
SUT VICRYL 2 TP 1 (SUTURE) ×2 IMPLANT
SWAB COLLECTION DEVICE MRSA (MISCELLANEOUS) ×2 IMPLANT
SYSTEM SAHARA CHEST DRAIN ATS (WOUND CARE) ×3 IMPLANT
TAPE CLOTH 4X10 WHT NS (GAUZE/BANDAGES/DRESSINGS) ×3 IMPLANT
TAPE CLOTH SURG 6X10 WHT LF (GAUZE/BANDAGES/DRESSINGS) ×2 IMPLANT
TOWEL OR 17X24 6PK STRL BLUE (TOWEL DISPOSABLE) ×3 IMPLANT
TOWEL OR 17X26 10 PK STRL BLUE (TOWEL DISPOSABLE) ×6 IMPLANT
TRAP SPECIMEN MUCOUS 40CC (MISCELLANEOUS) ×2 IMPLANT
TRAY FOLEY W/METER SILVER 16FR (SET/KITS/TRAYS/PACK) ×2 IMPLANT
TUBE ANAEROBIC SPECIMEN COL (MISCELLANEOUS) ×2 IMPLANT
WATER STERILE IRR 1000ML POUR (IV SOLUTION) ×4 IMPLANT

## 2016-05-13 NOTE — Anesthesia Procedure Notes (Signed)
Procedure Name: Intubation Date/Time: 05/13/2016 9:55 AM Performed by: Arlice ColtMANESS, Ashlyne Olenick B Pre-anesthesia Checklist: Patient identified, Emergency Drugs available, Suction available, Patient being monitored and Timeout performed Patient Re-evaluated:Patient Re-evaluated prior to inductionOxygen Delivery Method: Circle system utilized Preoxygenation: Pre-oxygenation with 100% oxygen Intubation Type: IV induction Ventilation: Two handed mask ventilation required and Oral airway inserted - appropriate to patient size Laryngoscope Size: Mac and 3 Grade View: Grade I Endobronchial tube: Left and Double lumen EBT and 37 Fr Number of attempts: 1 Placement Confirmation: ETT inserted through vocal cords under direct vision,  positive ETCO2 and breath sounds checked- equal and bilateral Secured at: 29 cm Tube secured with: Tape Dental Injury: Teeth and Oropharynx as per pre-operative assessment

## 2016-05-13 NOTE — Op Note (Signed)
05/13/2016 Allen Becker [id number]  Surgeon: Alleen BorneBryan K. Massimiliano Rohleder, MD   First Assistant: [name], RN  Preoperative Diagnosis: left empyema   Postoperative Diagnosis: left empyema and left upper lobe lung abscess  Procedure:  1. Left video-assisted thoracoscopy 2.Left muscle-sparing thoracotomy  3. Drainage of empyema  4. Decortication of the left lung  5. Debridement of left upper lobe lung abscess.  Anesthesia: General Endotracheal   Clinical History/Surgical Indication:   The patient is a 50 year old gentleman who works as a Psychologist, occupationalwelder at [employer] and reports a six week history of feeling poorly with productive cough, shortness of breath and left chest pain. He was seen at an urgent care and diagnosed with pneumonia and treated with oral antibiotics (Levaquin). He continued to work but continued to feel poorly and developed subjective fever and chills so he came to the [facility] ER. He had a leukocytosis of 21.5. A CXR showed a large left pleural effusion and CT showed a large loculated left pleural effusion with compressive atelectasis of the left lung. He had a left thoracentesis removing 460 cc of amber fluid that has not grown anything yet.   He has a large multiloculated left pleural effusion that is most likely an empyema with leukocytosis, fever. He has near complete collapse of the left lung so it is not possible to determine if there is underlying pneumonia or lung mass. I think the best treatment is surgical drainage with VATS or thoracotomy. I discussed the procedure with the patient including alternatives, benefits, and risks including but not limited to bleeding, blood transfusion, infection, prolonged air leak and he understands and agrees to proceed.   Preparation:  The patient was seen in the preoperative holding area and the correct patient, correct operation, correct operative sidewere confirmed with the patient after reviewing the medical record and CT scan. The consent was  signed by me. Preoperative antibiotics were given. The left side of the chest was signed by me. The patient was taken back to the operating room and positioned supine on the operating room table. After being placed under general endotracheal anesthesia by the anesthesia team using a double lumen tube a foley catheter was placed. The patient was turned into the right lateral decubitus position. The chest was prepped with betadine soap and solution. A surgical time-out was taken and the correct patient,operative side, and operative procedure were confirmed with the nursing and anesthesia staff.   Operative Procedure:  A 1 cm incision was made in the mid-axillary line at about the 8th intercostal space and an 8 mm trocar was inserted into the pleural space. It was immediately apparent that the pleural space was obliterated by the multi-loculated empyema and that this could not be done effectively by the VATS approach alone. Therefore a short lateral thoracotomy incision was made and the chest entered through the 7th ICS. The pleural space was filled with a yellowish green multiloculated empyema that was sero-purulent with a thin fibrinous peel over the lung. Cultures of the fluid and the pleural peel were taken.The empyema was completely drained and the left lung decorticated. There was a 3 cm lung abscess in the lateral aspect of the left upper lobe. This was close to the surface and looked like it had already ruptured into the pleural space at one point. It was unroofed and pus was drained. The abscess cavity was drained. Cultures of this were taken. This allowed complete expansion of the lung. The chest was irrigated with warm saline. Hemostasis  was complete. Two 24 F Bard drains were placed through separate stab incisions and were positioned posteriorly and anteriorly in the pleural space. The ribs were reapproximated with # 2 vicryl pericostal sutures and the muscles closed with continuous 0 vicryl suture. The  subcutaneous tissue was closed with 2-0 vicryl continuous suture. The skin was closed with 3-0 vicryl subcuticular suture. All sponge, needle, and instrument counts were reported correct at the end of the case. Dry sterile dressings were placed over the incisions and around the chest tubes which were connected to pleurevac suction. The patient was turned supine, extubated,then transported to the PACU in satisfactory and stable condition.

## 2016-05-13 NOTE — Progress Notes (Signed)
PROGRESS NOTE                                                                                                                                                                                                             Patient Demographics:    Allen Becker, is a 50 y.o. male, DOB - [birth year], WUJ:[id number]  Admit date - 05/08/2016   Admitting Physician Ripudeep Jenna LuoK Rai, MD  Outpatient Primary MD for the patient is No primary care provider on file.  LOS - 4  Outpatient Specialists: none  Chief Complaint  Patient presents with  . Shortness of Breath       Brief Narrative   50 year old male with history of tobacco use quit about 10 years ago (in 20-pack-year smoking history) admitted to [facility] ED with SIRS (t 101.6 F, leukocytosis and tachycardia). CT chest showed large partially located left pleural effusion with collapse/atelectasis of the left lower lobe and moderate left upper lobe atelectasis. He underwent diagnostic and therapeutic thoracentesis on 5/29 healing for 60 mL of fluid which was exudative and suggestive of parapneumonic effusion. Pulmonologist consulted and patient was [facility] evaluation and undergo VATS.   Subjective:   Cough better after adding antitussives. Denies chest pain or worsened shortness of breath.   Assessment  & Plan :    Principal Problem:   SIRS due to Lobar pneumonia with Loculated left-sided pleural effusion Patient started on Rocephin and azithromycin. Switched to vancomycin and Zosyn given persistent fever. -Continue O2 via nasal cannula. Still has some fever. - Diagnostic and therapeutic thoracentesis done on 5/29 suggestive of exudate. Blood and pleural fluid cultures negative to date. Pleural fluid cytology negative for malignancy -Patient undergoing VATS today.  Active Problems:  Essential hypertension Resume home blood  pressure medications.  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      Code Status : Full code  Family Communication  : None at bedside  Disposition Plan  : Home depending upon postoperative course.  Barriers For Discharge : Active infection.  VATS  Consults  :  Cardiothoracic surgery (Dr. Laneta SimmersBartle)  Procedures  : [facility] chest (at [facility])  DVT Prophylaxis  :  Lovenox -   Lab Results  Component Value Date   PLT 458* 05/13/2016    Antibiotics  :    Anti-infectives    Start     Dose/Rate Route Frequency Ordered Stop   05/13/16 0856  vancomycin (VANCOCIN) 1-5 GM/200ML-% IVPB    Comments:  Ray Church   : cabinet override      05/13/16 0856 05/13/16 2059   05/12/16 1245  vancomycin (VANCOCIN) 1,000 mg in sodium chloride 0.9 % 250 mL IVPB     1,000 mg 250 mL/hr over 60 Minutes Intravenous To Surgery 05/12/16 1236 05/13/16 1245   05/11/16 1200  [MAR Hold]  piperacillin-tazobactam (ZOSYN) IVPB 3.375 g     (MAR Hold since 05/13/16 0845)   3.375 g 12.5 mL/hr over 240 Minutes Intravenous Every 8 hours 05/11/16 1115     05/10/16 0000  cefTRIAXone (ROCEPHIN) 1 g in dextrose 5 % 50 mL IVPB  Status:  Discontinued     1 g 100 mL/hr over 30 Minutes Intravenous Every 24 hours 05/09/16 0219 05/11/16 1016   05/09/16 0230  azithromycin (ZITHROMAX) 500 mg in dextrose 5 % 250 mL IVPB  Status:  Discontinued     500 mg 250 mL/hr over 60 Minutes Intravenous Every 24 hours 05/09/16 0219 05/09/16 0239   05/08/16 2330  azithromycin (ZITHROMAX) 500 mg in dextrose 5 % 250 mL IVPB  Status:  Discontinued     500 mg 250 mL/hr over 60 Minutes Intravenous Every 24 hours 05/08/16 2318 05/11/16 1016   05/08/16 2315  cefTRIAXone (ROCEPHIN) 2 g in dextrose 5 % 50 mL IVPB     2 g 100 mL/hr over 30 Minutes Intravenous  Once 05/08/16 2318 05/09/16 0006        Objective:   Filed Vitals:   05/13/16 0416 05/13/16  0500 05/13/16 0600 05/13/16 0823  BP:  118/82 101/72 116/81  Pulse: 85 82 71 92  Temp:    100.1 F (37.8 C)  TempSrc:    Oral  Resp: 23 21 20 22   Height:      Weight: 75.3 kg (166 lb 0.1 oz)     SpO2: 99% 98% 98% 98%    Wt Readings from Last 3 Encounters:  05/13/16 75.3 kg (166 lb 0.1 oz)     Intake/Output Summary (Last 24 hours) at 05/13/16 1107 Last data filed at 05/13/16 1030  Gross per 24 hour  Intake 2763.75 ml  Output   1200 ml  Net 1563.75 ml     Physical Exam  Gen: not in distress HEENT: no pallor, moist mucosa, supple neck Chest: minimal breath sounds over lt lung. No added sounds CVS: N S1&S2, no murmurs, rubs or gallop GI: soft, NT, ND, BS+ Musculoskeletal: warm, no edema     Data Review:    CBC  Recent Labs Lab 05/08/16 2132 05/10/16 0841 05/11/16 1020 05/13/16 0340  WBC 21.5* 19.1* 16.6* 15.2*  HGB 14.3 12.7* 12.2* 10.7*  HCT 42.4 37.6* 36.6* 33.9*  PLT 530* 415* 436* 458*  MCV 87.6 87.2 87.6 87.4  MCH 29.5 29.5 29.2 27.6  MCHC 33.7 33.8 33.3 31.6  RDW 12.4 12.5 12.4 12.5  LYMPHSABS 1.1  --   --   --   MONOABS 1.8*  --   --   --   EOSABS 0.1  --   --   --   BASOSABS 0.0  --   --   --     Chemistries   Recent Labs Lab 05/08/16 2132 05/10/16 0841 05/11/16 1020  NA  136 131* 135  K 4.0 3.8 3.7  CL 101 97* 100*  CO2 23 26 27   GLUCOSE 154* 122* 137*  BUN 19 17 17   CREATININE 0.91 0.67 0.66  CALCIUM 8.6* 8.2* 8.0*  AST  --  41  --   ALT  --  94*  --   ALKPHOS  --  147*  --   BILITOT  --  1.5*  --    ------------------------------------------------------------------------------------------------------------------ No results for input(s): CHOL, HDL, LDLCALC, TRIG, CHOLHDL, LDLDIRECT in the last 72 hours.  Lab Results  Component Value Date   HGBA1C 5.6 05/10/2016   ------------------------------------------------------------------------------------------------------------------ No results for input(s): TSH, T4TOTAL, T3FREE,  THYROIDAB in the last 72 hours.  Invalid input(s): FREET3 ------------------------------------------------------------------------------------------------------------------ No results for input(s): VITAMINB12, FOLATE, FERRITIN, TIBC, IRON, RETICCTPCT in the last 72 hours.  Coagulation profile  Recent Labs Lab 05/08/16 2138  INR 1.33    No results for input(s): DDIMER in the last 72 hours.  Cardiac Enzymes No results for input(s): CKMB, TROPONINI, MYOGLOBIN in the last 168 hours.  Invalid input(s): CK ------------------------------------------------------------------------------------------------------------------ No results found for: BNP  Inpatient Medications  Scheduled Meds: . [MAR Hold] amLODipine  5 mg Oral Daily   And  . [MAR Hold] irbesartan  150 mg Oral Daily  . [MAR Hold] antiseptic oral rinse  7 mL Mouth Rinse BID  . [MAR Hold] atorvastatin  10 mg Oral Daily  . [MAR Hold] dextromethorphan-guaiFENesin  1 tablet Oral BID  . [MAR Hold] enoxaparin (LOVENOX) injection  40 mg Subcutaneous Q24H  . [MAR Hold] piperacillin-tazobactam (ZOSYN)  IV  3.375 g Intravenous Q8H  . vancomycin  1,000 mg Intravenous To OR  . vancomycin       Continuous Infusions: . 0.9 % NaCl with KCl 20 mEq / L 75 mL/hr at 05/12/16 0523  . lactated ringers     PRN Meds:.0.9 % irrigation (POUR BTL), [MAR Hold] acetaminophen **OR** [MAR Hold] acetaminophen, [MAR Hold] HYDROcodone-homatropine, [MAR Hold]  morphine injection, [MAR Hold] ondansetron **OR** [MAR Hold] ondansetron (ZOFRAN) IV  Micro Results Recent Results (from the past 240 hour(s))  Culture, blood (Routine X 2) w Reflex to ID Panel     Status: None (Preliminary result)   Collection Time: 05/10/16  8:41 AM  Result Value Ref Range Status   Specimen Description BLOOD RIGHT ANTECUBITAL  Final   Special Requests BOTTLES DRAWN AEROBIC AND ANAEROBIC 12CC EACH  Final   Culture NO GROWTH 3 DAYS  Final   Report Status PENDING  Incomplete    MRSA PCR Screening     Status: None   Collection Time: 05/10/16  8:45 AM  Result Value Ref Range Status   MRSA by PCR NEGATIVE NEGATIVE Final    Comment:        The GeneXpert MRSA Assay (FDA approved for NASAL specimens only), is one component of a comprehensive MRSA colonization surveillance program. It is not intended to diagnose MRSA infection nor to guide or monitor treatment for MRSA infections.   Culture, blood (Routine X 2) w Reflex to ID Panel     Status: None (Preliminary result)   Collection Time: 05/10/16  8:46 AM  Result Value Ref Range Status   Specimen Description BLOOD LEFT ANTECUBITAL  Final   Special Requests BOTTLES DRAWN AEROBIC AND ANAEROBIC 12CC EACH  Final   Culture NO GROWTH 3 DAYS  Final   Report Status PENDING  Incomplete  Culture, body fluid-bottle     Status: None (Preliminary result)   Collection  Time: 05/10/16 11:28 AM  Result Value Ref Range Status   Specimen Description FLUID PLEURAL LEFT  Final   Special Requests NONE  Final   Culture   Final    NO GROWTH 2 DAYS Performed at [facility]    Report Status PENDING  Incomplete  Gram stain     Status: None   Collection Time: 05/10/16 11:28 AM  Result Value Ref Range Status   Specimen Description FLUID PLEURAL LEFT  Final   Special Requests NONE  Final   Gram Stain   Final    FEW WBC PRESENT, PREDOMINANTLY MONONUCLEAR NO ORGANISMS SEEN Performed at [facility]    Report Status 05/10/2016 FINAL  Final  MRSA PCR Screening     Status: None   Collection Time: 05/11/16  8:41 PM  Result Value Ref Range Status   MRSA by PCR NEGATIVE NEGATIVE Final    Comment:        The GeneXpert MRSA Assay (FDA approved for NASAL specimens only), is one component of a comprehensive MRSA colonization surveillance program. It is not intended to diagnose MRSA infection nor to guide or monitor treatment for MRSA infections.   Surgical PCR screen     Status: None   Collection Time: 05/13/16   4:15 AM  Result Value Ref Range Status   MRSA, PCR NEGATIVE NEGATIVE Final   Staphylococcus aureus NEGATIVE NEGATIVE Final    Comment:        The Xpert SA Assay (FDA approved for NASAL specimens in patients over 72 years of age), is one component of a comprehensive surveillance program.  Test performance has been validated by [facility] for patients greater than or equal to 34 year old. It is not intended to diagnose infection nor to guide or monitor treatment.     Radiology Reports Dg Chest 1 View  05/10/2016  CLINICAL DATA:  Status post left-sided thoracentesis. EXAM: CHEST 1 VIEW COMPARISON:  CT of 05/08/2016 FINDINGS: Minimal tracheal deviation right. Normal heart size. Moderate left-sided loculated pleural effusion is similar. No pneumothorax. Clear right lung. Near complete opacification of the inferior left hemi thorax is not significantly changed. IMPRESSION: No pneumothorax. Similar moderate loculated left pleural effusion with adjacent collapse/ consolidation. Electronically Signed   By: Jeronimo Greaves M.D.   On: 05/10/2016 11:39   Dg Chest 2 View  05/08/2016  CLINICAL DATA:  Initial evaluation for acute cough, wheezing. EXAM: CHEST  2 VIEW COMPARISON:  None. FINDINGS: Cardiac silhouette grossly within normal limits, although the left heart border is obscured on this exam. Mediastinal silhouette within normal limits. Tracheal air column mildly deviated to the right. Lungs normally inflated. AE large left pleural effusion is present. Associated left basilar atelectasis and/ or consolidation. Superimposed mass not excluded. Left lung apex is aerated. Right lung is clear. No pneumothorax. No acute osseous abnormality. IMPRESSION: Large left pleural effusion with associated left lower lobe atelectasis and/ or consolidation. Superimposed mass not excluded. Further evaluation with cross-sectional imaging is suggested to further delineate these findings. Electronically Signed   By: Rise Mu M.D.   On: 05/08/2016 22:25   Ct Chest Wo Contrast  05/09/2016  CLINICAL DATA:  Shortness of breath. Recent diagnosis of pneumonia. Abnormal chest x-ray. EXAM: CT CHEST WITHOUT CONTRAST TECHNIQUE: Multidetector CT imaging of the chest was performed following the standard protocol without IV contrast. COMPARISON:  Chest radiographs earlier this day. FINDINGS: Large partially loculated left pleural effusion measuring simple fluid density. This causes complete collapse/  atelectasis of the left lower lobe and moderate left upper lobe atelectasis. Tiny, 3 mm, pleural-based nodule was seen in the aerated left upper lobe posteriorly. There is mild rightward mediastinal shift secondary to large pleural effusion. Scattered air bronchograms are seen. No evidence of endobronchial lesion. No focal consolidation in the right lung. No right pleural effusion. No evidence pulmonary edema. Small mediastinal lymph nodes are likely reactive. Limited assessment for hilar adenopathy given lack of contrast. The thoracic aorta is normal in caliber. No acute abnormality in the included upper abdomen. No adrenal nodule. There are no acute or suspicious osseous abnormalities. IMPRESSION: 1. Large partially loculated left pleural effusion. This cause of complete collapse/atelectasis of the left lower lobe and moderate left upper lobe atelectasis. This limits assessment of the underlying lung parenchyma. Underlying pneumonia or pulmonary mass cannot be assessed. Recommend follow-up imaging after drainage or resolution of pleural fluid. 2. No focal abnormality in the right lung. Electronically Signed   By: Rubye Oaks M.D.   On: 05/09/2016 00:09   US Thoracentesis Asp Pleural Space W/img Guide  05/10/2016  INDICATION: Smoker, dyspnea, cough, pneumonia, left pleural effusion. Request made for diagnostic and therapeutic left thoracentesis. EXAM: ULTRASOUND GUIDED DIAGNOSTIC AND THERAPEUTIC LEFT THORACENTESIS MEDICATIONS:  None. COMPLICATIONS: None immediate. PROCEDURE: An ultrasound guided thoracentesis was thoroughly discussed with the patient and questions answered. The benefits, risks, alternatives and complications were also discussed. The patient understands and wishes to proceed with the procedure. Written consent was obtained. Ultrasound was performed to localize and mark an adequate pocket of fluid in the left chest. The area was then prepped and draped in the normal sterile fashion. 1% Lidocaine was used for local anesthesia. Under ultrasound guidance a Safe-T-Centesis catheter was introduced. Thoracentesis was performed. The catheter was removed and a dressing applied. FINDINGS: A total of approximately 460 cc of hazy, amber fluid was removed. Samples were sent to the laboratory as requested by the clinical team. The pleural fluid collection was extensively multiloculated by today's US imaging. Only the above amount could be removed at this time. Recommend TCTS consult if clinical status does not improve. IMPRESSION: Successful ultrasound guided diagnostic and therapeutic left thoracentesis yielding 460 cc of pleural fluid. Read by: Jeananne Rama, PA-C Electronically Signed   By: Malachy Moan M.D.   On: 05/10/2016 11:19    Time Spent in minutes  25   Eddie North M.D on 05/13/2016 at 11:07 AM  Between 7am to 7pm - Pager - [phone]  After 7pm go to [email or web address] - password [facility]  [facility] -  Office  [phone]

## 2016-05-13 NOTE — Brief Op Note (Signed)
05/08/2016 - 05/13/2016  11:59 AM  PATIENT:  Allen Becker  50 y.o. male  PRE-OPERATIVE DIAGNOSIS:  loculated left pleural effusion with compressive atelectasis of the left lung  POST-OPERATIVE DIAGNOSIS: Left empyema with lung abscess  PROCEDURE:  Procedure(s): Left VATS, left muscle-sparing thoracoscopy, drainage of empyema and left upper lobe lung abscess, decortication of left lung SURGEON:  Surgeon(s) and Role:    * Alleen BorneBryan K Bartle, MD - Primary  PHYSICIAN ASSISTANT: none  ASSISTANTS: [name], RN   ANESTHESIA:   general  EBL:  Total I/O In: 50 [IV Piggyback:50] Out: 450 [Urine:400; Blood:50]  BLOOD ADMINISTERED:none  DRAINS: (two 32 F ) Blake drain(s) in the left pleural space   LOCAL MEDICATIONS USED:  NONE  SPECIMEN:  Source of Specimen:  cultures of left empyema, fibrinous pleural peel and lung abscess  DISPOSITION OF SPECIMEN:  micro  COUNTS:  YES  TOURNIQUET:  * No tourniquets in log *  DICTATION: .Note written in EPIC  PLAN OF CARE: Admit to inpatient   PATIENT DISPOSITION:  ICU - extubated and stable.   Delay start of Pharmacological VTE agent (>24hrs) due to surgical blood loss or risk of bleeding: yes

## 2016-05-13 NOTE — Transfer of Care (Signed)
Immediate Anesthesia Transfer of Care Note  Patient: Allen Becker  Procedure(s) Performed: Procedure(s): LEFT VIDEO ASSISTED THORACOSCOPY (VATS)/DECORTICATION (Left) DRAINAGE OF LEFT PLEURAL EFFUSION (Left)  Patient Location: PACU  Anesthesia Type:General  Level of Consciousness: awake, alert , oriented and patient cooperative  Airway & Oxygen Therapy: Patient Spontanous Breathing and Patient connected to face mask oxygen  Post-op Assessment: Report given to RN and Post -op Vital signs reviewed and stable  Post vital signs: Reviewed and stable  Last Vitals:  Filed Vitals:   05/13/16 0600 05/13/16 0823  BP: 101/72 116/81  Pulse: 71 92  Temp:  37.8 C  Resp: 20 22    Last Pain:  Filed Vitals:   05/13/16 0929  PainSc: 0-No pain      Patients Stated Pain Goal: 0 (05/12/16 2114)  Complications: No apparent anesthesia complications

## 2016-05-13 NOTE — Progress Notes (Signed)
MD Dhunghel provided note to patient to be faxed for work absence. Dhunghel also gave verbal ok for patient to travel off tele to OR. Paperwork is with patient in his belongings bag.

## 2016-05-13 NOTE — Progress Notes (Signed)
Patient ID: Allen Becker, male   DOB: [birth year], 50 y.o.   MRN: [id number]   SICU Evening Rounds:   Hemodynamically stable  Awake and alert  Urine output good  CT output low  CBC    Component Value Date/Time   WBC 15.2* 05/13/2016 0340   RBC 3.88* 05/13/2016 0340   HGB 10.7* 05/13/2016 0340   HCT 33.9* 05/13/2016 0340   PLT 458* 05/13/2016 0340   MCV 87.4 05/13/2016 0340   MCH 27.6 05/13/2016 0340   MCHC 31.6 05/13/2016 0340   RDW 12.5 05/13/2016 0340   LYMPHSABS 1.1 05/08/2016 2132   MONOABS 1.8* 05/08/2016 2132   EOSABS 0.1 05/08/2016 2132   BASOSABS 0.0 05/08/2016 2132     BMET    Component Value Date/Time   NA 135 05/11/2016 1020   K 3.7 05/11/2016 1020   CL 100* 05/11/2016 1020   CO2 27 05/11/2016 1020   GLUCOSE 137* 05/11/2016 1020   BUN 17 05/11/2016 1020   CREATININE 0.66 05/11/2016 1020   CALCIUM 8.0* 05/11/2016 1020   GFRNONAA >60 05/11/2016 1020   GFRAA >60 05/11/2016 1020     A/P:  Stable postop course. Continue current plans. Will add Toradol for pain.

## 2016-05-13 NOTE — OR Nursing (Signed)
Patient belongings given to patient's son dustin

## 2016-05-14 ENCOUNTER — Inpatient Hospital Stay (HOSPITAL_COMMUNITY): Payer: BLUE CROSS/BLUE SHIELD

## 2016-05-14 ENCOUNTER — Encounter (HOSPITAL_COMMUNITY): Payer: Self-pay | Admitting: Surgery

## 2016-05-14 LAB — BASIC METABOLIC PANEL
ANION GAP: 10 (ref 5–15)
BUN: 9 mg/dL (ref 6–20)
CALCIUM: 8.2 mg/dL — AB (ref 8.9–10.3)
CO2: 27 mmol/L (ref 22–32)
CREATININE: 1.04 mg/dL (ref 0.61–1.24)
Chloride: 98 mmol/L — ABNORMAL LOW (ref 101–111)
Glucose, Bld: 131 mg/dL — ABNORMAL HIGH (ref 65–99)
Potassium: 3.6 mmol/L (ref 3.5–5.1)
SODIUM: 135 mmol/L (ref 135–145)

## 2016-05-14 LAB — BLOOD GAS, ARTERIAL
ACID-BASE EXCESS: 4.1 mmol/L — AB (ref 0.0–2.0)
Bicarbonate: 27.9 mEq/L — ABNORMAL HIGH (ref 20.0–24.0)
DRAWN BY: 252031
O2 CONTENT: 1 L/min
O2 SAT: 94.3 %
PATIENT TEMPERATURE: 98.6
PCO2 ART: 40.5 mmHg (ref 35.0–45.0)
TCO2: 29.1 mmol/L (ref 0–100)
pH, Arterial: 7.453 — ABNORMAL HIGH (ref 7.350–7.450)
pO2, Arterial: 69.6 mmHg — ABNORMAL LOW (ref 80.0–100.0)

## 2016-05-14 LAB — GLUCOSE, CAPILLARY
GLUCOSE-CAPILLARY: 118 mg/dL — AB (ref 65–99)
GLUCOSE-CAPILLARY: 161 mg/dL — AB (ref 65–99)
Glucose-Capillary: 129 mg/dL — ABNORMAL HIGH (ref 65–99)
Glucose-Capillary: 140 mg/dL — ABNORMAL HIGH (ref 65–99)
Glucose-Capillary: 142 mg/dL — ABNORMAL HIGH (ref 65–99)

## 2016-05-14 LAB — CBC
HCT: 33.9 % — ABNORMAL LOW (ref 39.0–52.0)
HEMOGLOBIN: 10.9 g/dL — AB (ref 13.0–17.0)
MCH: 27.7 pg (ref 26.0–34.0)
MCHC: 32.2 g/dL (ref 30.0–36.0)
MCV: 86 fL (ref 78.0–100.0)
PLATELETS: 468 10*3/uL — AB (ref 150–400)
RBC: 3.94 MIL/uL — AB (ref 4.22–5.81)
RDW: 12.5 % (ref 11.5–15.5)
WBC: 18.3 10*3/uL — AB (ref 4.0–10.5)

## 2016-05-14 MED ORDER — DEXTROSE 5 % IV SOLN
1.0000 g | Freq: Two times a day (BID) | INTRAVENOUS | Status: DC
  Administered 2016-05-14 – 2016-05-17 (×8): 1 g via INTRAVENOUS
  Filled 2016-05-14 (×11): qty 10

## 2016-05-14 MED ORDER — POTASSIUM CHLORIDE CRYS ER 20 MEQ PO TBCR
40.0000 meq | EXTENDED_RELEASE_TABLET | Freq: Once | ORAL | Status: AC
  Administered 2016-05-14: 40 meq via ORAL
  Filled 2016-05-14: qty 2

## 2016-05-14 NOTE — Progress Notes (Signed)
1 Day Post-Op Procedure(s) (LRB): LEFT VIDEO ASSISTED THORACOSCOPY (VATS)/DECORTICATION (Left) DRAINAGE OF LEFT PLEURAL EFFUSION (Left) Subjective:  Pain under control on PCA and Toradol   Objective: Vital signs in last 24 hours: Temp:  [97.8 F (36.6 C)-100.1 F (37.8 C)] 98.8 F (37.1 C) (06/02 0400) Pulse Rate:  [78-100] 86 (06/02 0700) Cardiac Rhythm:  [-] Normal sinus rhythm (06/02 0400) Resp:  [14-30] 18 (06/02 0700) BP: (104-132)/(71-96) 111/80 mmHg (06/02 0700) SpO2:  [91 %-98 %] 95 % (06/02 0700) Arterial Line BP: (92-186)/(56-99) 109/56 mmHg (06/02 0700)  Hemodynamic parameters for last 24 hours:    Intake/Output from previous day: 06/01 0701 - 06/02 0700 In: 2960 [P.O.:200; I.V.:2360; IV Piggyback:400] Out: 2295 [Urine:1925; Blood:50; Chest Tube:320] Intake/Output this shift:    General appearance: alert and cooperative Heart: regular rate and rhythm, S1, S2 normal, no murmur, click, rub or gallop Lungs: diminished breath sounds left lung no air leak from chest tubes  Lab Results:  Recent Labs  05/13/16 0340 05/14/16 0351  WBC 15.2* 18.3*  HGB 10.7* 10.9*  HCT 33.9* 33.9*  PLT 458* 468*   BMET:  Recent Labs  05/11/16 1020 05/14/16 0351  NA 135 135  K 3.7 3.6  CL 100* 98*  CO2 27 27  GLUCOSE 137* 131*  BUN 17 9  CREATININE 0.66 1.04  CALCIUM 8.0* 8.2*    PT/INR: No results for input(s): LABPROT, INR in the last 72 hours. ABG    Component Value Date/Time   PHART 7.453* 05/14/2016 0348   HCO3 27.9* 05/14/2016 0348   TCO2 29.1 05/14/2016 0348   O2SAT 94.3 05/14/2016 0348   CBG (last 3)   Recent Labs  05/13/16 2014 05/13/16 2354 05/14/16 0501  GLUCAP 142* 129* 118*   CLINICAL DATA: Followup lung surgery  EXAM: PORTABLE CHEST 1 VIEW  COMPARISON: 05/13/2016  FINDINGS: Right lung is clear. Cardiac enlargement stable. Stable chest tubes on the left. Left pleural effusion again identified. Mildly increased density left  lung base could represent increased consolidation and or increase in the size of left pleural effusion when compared to prior study. No pneumothorax.  IMPRESSION: Similar appearance to prior study but with increased left base opacity   Electronically Signed  By: Esperanza Heiraymond Rubner M.D.  On: 05/14/2016 07:25  Assessment/Plan: S/P Procedure(s) (LRB): LEFT VIDEO ASSISTED THORACOSCOPY (VATS)/DECORTICATION (Left) DRAINAGE OF LEFT PLEURAL EFFUSION (Left)  He has been hemodynamically stable.  Continue chest tubes to suction. Should be able to remove one tomorrow.  Operative cultures pending but gram stain of lung abscess showed abundant gram positives in chains. He was on Zosyn preop. Will switch to Ceftriaxone pending cultures. Received vanc periop but will not continue this unless MRSA grows out.  Continue IS, start ambulation.  DC arterial line.  Will keep foley in until mobile.   LOS: 5 days    Alleen BorneBryan K Bartle 05/14/2016

## 2016-05-14 NOTE — Progress Notes (Signed)
TCTS BRIEF SICU PROGRESS NOTE  1 Day Post-Op  S/P Procedure(s) (LRB): LEFT VIDEO ASSISTED THORACOSCOPY (VATS)/DECORTICATION (Left) DRAINAGE OF LEFT PLEURAL EFFUSION (Left)   Stable day  Plan: Continue current plan  Purcell Nailslarence H Emmalina Espericueta, MD 05/14/2016 5:54 PM

## 2016-05-15 ENCOUNTER — Inpatient Hospital Stay (HOSPITAL_COMMUNITY): Payer: BLUE CROSS/BLUE SHIELD

## 2016-05-15 LAB — COMPREHENSIVE METABOLIC PANEL
ALK PHOS: 158 U/L — AB (ref 38–126)
ALT: 40 U/L (ref 17–63)
AST: 22 U/L (ref 15–41)
Albumin: 1.5 g/dL — ABNORMAL LOW (ref 3.5–5.0)
Anion gap: 7 (ref 5–15)
BUN: 12 mg/dL (ref 6–20)
CALCIUM: 8 mg/dL — AB (ref 8.9–10.3)
CO2: 30 mmol/L (ref 22–32)
CREATININE: 1.48 mg/dL — AB (ref 0.61–1.24)
Chloride: 99 mmol/L — ABNORMAL LOW (ref 101–111)
GFR, EST NON AFRICAN AMERICAN: 54 mL/min — AB (ref >60)
Glucose, Bld: 148 mg/dL — ABNORMAL HIGH (ref 65–99)
Potassium: 3.8 mmol/L (ref 3.5–5.1)
SODIUM: 136 mmol/L (ref 135–145)
Total Bilirubin: 0.7 mg/dL (ref 0.3–1.2)
Total Protein: 5.8 g/dL — ABNORMAL LOW (ref 6.5–8.1)

## 2016-05-15 LAB — CBC
HEMATOCRIT: 30.6 % — AB (ref 39.0–52.0)
Hemoglobin: 9.7 g/dL — ABNORMAL LOW (ref 13.0–17.0)
MCH: 27.8 pg (ref 26.0–34.0)
MCHC: 31.7 g/dL (ref 30.0–36.0)
MCV: 87.7 fL (ref 78.0–100.0)
Platelets: 504 10*3/uL — ABNORMAL HIGH (ref 150–400)
RBC: 3.49 MIL/uL — ABNORMAL LOW (ref 4.22–5.81)
RDW: 12.8 % (ref 11.5–15.5)
WBC: 16.5 10*3/uL — AB (ref 4.0–10.5)

## 2016-05-15 LAB — CULTURE, BLOOD (ROUTINE X 2)
CULTURE: NO GROWTH
Culture: NO GROWTH

## 2016-05-15 LAB — CULTURE, BODY FLUID W GRAM STAIN -BOTTLE: Culture: NO GROWTH

## 2016-05-15 LAB — GLUCOSE, CAPILLARY
GLUCOSE-CAPILLARY: 114 mg/dL — AB (ref 65–99)
Glucose-Capillary: 117 mg/dL — ABNORMAL HIGH (ref 65–99)
Glucose-Capillary: 139 mg/dL — ABNORMAL HIGH (ref 65–99)

## 2016-05-15 LAB — CULTURE, BODY FLUID-BOTTLE

## 2016-05-15 NOTE — Progress Notes (Addendum)
      [address]       [place] [id number]             [phone]        CARDIOTHORACIC SURGERY PROGRESS NOTE   R2 Days Post-Op Procedure(s) (LRB): LEFT VIDEO ASSISTED THORACOSCOPY (VATS)/DECORTICATION (Left) DRAINAGE OF LEFT PLEURAL EFFUSION (Left)  Subjective: Feels okay.  Mild soreness in chest.  Ambulated already this morning.  No SOB.  Adequate analgesia.  No cough  Objective: Vital signs: BP Readings from Last 1 Encounters:  05/15/16 111/72   Pulse Readings from Last 1 Encounters:  05/15/16 80   Resp Readings from Last 1 Encounters:  05/15/16 18   Temp Readings from Last 1 Encounters:  05/15/16 97.7 F (36.5 C) Oral    Hemodynamics:    Physical Exam:  Rhythm:   sinus  Breath sounds: Diminished on left  Heart sounds:  RRR  Incisions:  Dressings dry, intact  Abdomen:  Soft, non-distended, non-tender  Extremities:  Warm, well-perfused  Chest tubes:  trivial volume thin serosanguinous output, no air leak    Intake/Output from previous day: 06/02 0701 - 06/03 0700 In: 4060 [P.O.:1560; I.V.:2400; IV Piggyback:100] Out: 1190 [Urine:1140; Chest Tube:50] Intake/Output this shift: Total I/O In: 200 [I.V.:200] Out: -   Lab Results:  CBC: Recent Labs  05/14/16 0351 05/15/16 0320  WBC 18.3* 16.5*  HGB 10.9* 9.7*  HCT 33.9* 30.6*  PLT 468* 504*    BMET:  Recent Labs  05/14/16 0351 05/15/16 0320  NA 135 136  K 3.6 3.8  CL 98* 99*  CO2 27 30  GLUCOSE 131* 148*  BUN 9 12  CREATININE 1.04 1.48*  CALCIUM 8.2* 8.0*     PT/INR:  No results for input(s): LABPROT, INR in the last 72 hours.  CBG (last 3)   Recent Labs  05/14/16 2354 05/15/16 0326 05/15/16 0759  GLUCAP 117* 139* 114*    ABG    Component Value Date/Time   PHART 7.453* 05/14/2016 0348   PCO2ART 40.5 05/14/2016 0348   PO2ART 69.6* 05/14/2016 0348   HCO3 27.9* 05/14/2016 0348   TCO2 29.1 05/14/2016 0348   O2SAT 94.3 05/14/2016 0348     CXR: stable  Assessment/Plan: S/P Procedure(s) (LRB): LEFT VIDEO ASSISTED THORACOSCOPY (VATS)/DECORTICATION (Left) DRAINAGE OF LEFT PLEURAL EFFUSION (Left)  Overall stable POD2 Trivial chest tube output, no air leak No fevers, leukocytosis improving Pleural fluid cultures remain no growth so far Creatinine up today 1.5 - likely acute kidney injury due to pre-renal azotemia and/or toradol   D/C anterior chest tube  Mobilize  Continue IV fluids  Watch renal function  Transfer step down   Allen Nailslarence H Owen, MD 05/15/2016 9:47 AM

## 2016-05-16 ENCOUNTER — Inpatient Hospital Stay (HOSPITAL_COMMUNITY): Payer: BLUE CROSS/BLUE SHIELD

## 2016-05-16 LAB — BASIC METABOLIC PANEL
ANION GAP: 8 (ref 5–15)
BUN: 11 mg/dL (ref 6–20)
CHLORIDE: 100 mmol/L — AB (ref 101–111)
CO2: 29 mmol/L (ref 22–32)
CREATININE: 1.18 mg/dL (ref 0.61–1.24)
Calcium: 7.9 mg/dL — ABNORMAL LOW (ref 8.9–10.3)
GFR calc non Af Amer: >60 mL/min (ref >60)
GLUCOSE: 127 mg/dL — AB (ref 65–99)
Potassium: 3.8 mmol/L (ref 3.5–5.1)
Sodium: 137 mmol/L (ref 135–145)

## 2016-05-16 MED ORDER — ONDANSETRON HCL 4 MG/2ML IJ SOLN
INTRAMUSCULAR | Status: AC
  Administered 2016-05-16: 4 mg
  Filled 2016-05-16: qty 2

## 2016-05-16 MED ORDER — LEVALBUTEROL HCL 0.63 MG/3ML IN NEBU
0.6300 mg | INHALATION_SOLUTION | Freq: Three times a day (TID) | RESPIRATORY_TRACT | Status: DC
  Filled 2016-05-16 (×2): qty 3

## 2016-05-16 NOTE — Progress Notes (Signed)
      [address]       [place] [id number]             [phone]        CARDIOTHORACIC SURGERY PROGRESS NOTE   R3 Days Post-Op Procedure(s) (LRB): LEFT VIDEO ASSISTED THORACOSCOPY (VATS)/DECORTICATION (Left) DRAINAGE OF LEFT PLEURAL EFFUSION (Left)  Subjective: Feels better  Objective: Vital signs: BP Readings from Last 1 Encounters:  05/16/16 138/99   Pulse Readings from Last 1 Encounters:  05/16/16 71   Resp Readings from Last 1 Encounters:  05/16/16 17   Temp Readings from Last 1 Encounters:  05/16/16 97.8 F (36.6 C) Oral    Hemodynamics:    Physical Exam:  Rhythm:   sinus  Breath sounds: Diminished left base  Heart sounds:  RRR  Incisions:  Clean and dry  Abdomen:  Soft, non-distended, non-tender  Extremities:  Warm, well-perfused  Chest tubes:  low volume thin serosanguinous output, no air leak    Intake/Output from previous day: 06/03 0701 - 06/04 0700 In: 2980 [P.O.:480; I.V.:2400; IV Piggyback:100] Out: 1430 [Urine:1350; Chest Tube:80] Intake/Output this shift: Total I/O In: 100 [I.V.:100] Out: -   Lab Results:  CBC: Recent Labs  05/14/16 0351 05/15/16 0320  WBC 18.3* 16.5*  HGB 10.9* 9.7*  HCT 33.9* 30.6*  PLT 468* 504*    BMET:  Recent Labs  05/15/16 0320 05/16/16 0313  NA 136 137  K 3.8 3.8  CL 99* 100*  CO2 30 29  GLUCOSE 148* 127*  BUN 12 11  CREATININE 1.48* 1.18  CALCIUM 8.0* 7.9*     PT/INR:  No results for input(s): LABPROT, INR in the last 72 hours.  CBG (last 3)   Recent Labs  05/14/16 2354 05/15/16 0326 05/15/16 0759  GLUCAP 117* 139* 114*    ABG    Component Value Date/Time   PHART 7.453* 05/14/2016 0348   PCO2ART 40.5 05/14/2016 0348   PO2ART 69.6* 05/14/2016 0348   HCO3 27.9* 05/14/2016 0348   TCO2 29.1 05/14/2016 0348   O2SAT 94.3 05/14/2016 0348    CXR: CHEST 2 VIEW  COMPARISON: 05/15/2016  FINDINGS: Left chest tube remains in place, unchanged. No  pneumothorax. Moderate loculated left lateral pleural effusion again noted, stable. Diffuse left lung atelectasis or infiltrate stable. Mild cardiomegaly. No focal opacity on the right.  IMPRESSION: No significant change since prior study.   Electronically Signed  By: Charlett NoseKevin Dover M.D.  On: 05/16/2016 07:24  Assessment/Plan: S/P Procedure(s) (LRB): LEFT VIDEO ASSISTED THORACOSCOPY (VATS)/DECORTICATION (Left) DRAINAGE OF LEFT PLEURAL EFFUSION (Left)  Doing well POD3 Trivial chest tube output No fevers, leukocytosis improving Creatinine back to normal Pleural fluid cultures remain pending   D/C IV fluids  D/C chest tube  [facility]  [facility]   Purcell Nailslarence H Kyiah Canepa, MD 05/16/2016 9:50 AM

## 2016-05-16 NOTE — Progress Notes (Signed)
Discontinued PCA Fentanyl as per orders of Dr. Cornelius Moraswen.  Wasted 27cc Fentanyl PCA with Dessa PhiShane Taylor, RN in sink/flushed.

## 2016-05-17 ENCOUNTER — Inpatient Hospital Stay (HOSPITAL_COMMUNITY): Payer: BLUE CROSS/BLUE SHIELD

## 2016-05-17 LAB — CBC
HEMATOCRIT: 30.9 % — AB (ref 39.0–52.0)
HEMOGLOBIN: 9.7 g/dL — AB (ref 13.0–17.0)
MCH: 27.6 pg (ref 26.0–34.0)
MCHC: 31.4 g/dL (ref 30.0–36.0)
MCV: 87.8 fL (ref 78.0–100.0)
PLATELETS: 602 10*3/uL — AB (ref 150–400)
RBC: 3.52 MIL/uL — AB (ref 4.22–5.81)
RDW: 13 % (ref 11.5–15.5)
WBC: 10.3 10*3/uL (ref 4.0–10.5)

## 2016-05-17 LAB — GLUCOSE, CAPILLARY: GLUCOSE-CAPILLARY: 135 mg/dL — AB (ref 65–99)

## 2016-05-17 LAB — BASIC METABOLIC PANEL
Anion gap: 7 (ref 5–15)
BUN: 8 mg/dL (ref 6–20)
CALCIUM: 8.6 mg/dL — AB (ref 8.9–10.3)
CO2: 29 mmol/L (ref 22–32)
CREATININE: 1.08 mg/dL (ref 0.61–1.24)
Chloride: 102 mmol/L (ref 101–111)
GFR calc non Af Amer: >60 mL/min (ref >60)
GLUCOSE: 107 mg/dL — AB (ref 65–99)
Potassium: 4.1 mmol/L (ref 3.5–5.1)
Sodium: 138 mmol/L (ref 135–145)

## 2016-05-17 NOTE — Discharge Summary (Signed)
Physician Discharge Summary  Patient ID: Allen Becker MRN: [id number] DOB/AGE: 1966/11/27 50 y.o.  Admit date: 05/08/2016 Discharge date: 05/18/2016  Admission Diagnoses:  Patient Active Problem List   Diagnosis Date Noted  . Empyema (HCC) 05/13/2016  . Hyponatremia 05/11/2016  . Loculated pleural effusion 05/10/2016  . Hyperglycemia 05/10/2016  . HTN (hypertension) 05/10/2016  . Pleural effusion on left 05/09/2016  . 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 CAP (community acquired pneumonia) 05/09/2016   Discharge Diagnoses:   Patient Active Problem List   Diagnosis Date Noted  . Empyema (HCC) 05/13/2016  . Hyponatremia 05/11/2016  . Loculated pleural effusion 05/10/2016  . Hyperglycemia 05/10/2016  . HTN (hypertension) 05/10/2016  . Pleural effusion on left 05/09/2016  . 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 CAP (community acquired pneumonia) 05/09/2016   Discharged Condition: good  History of Present Illness:  Mr. Allen Becker is a 50 yo male who has been feeling poorly for the past 6 weeks.  He has also experiencing productive cough, shortness of breath, and left sided chest pain.  He presented to an Urgent care for evaluation and was diagnosed with pneumonia and treated with Levaquin.  The patient continues to work, but despite treatment continued to feel poorly and developed fevers and chills.  [facility] where workup revealed leukocytosis of 21.5.  CXR was also obtained and showed a large left pleural effusion.  Further workup with CT scan showed a large loculated pleural effusion with atelectasis of the left lung.  He underwent Thoracentesis which removed 460 cc of amber fluid which was sent for culture.  It was felt patient would benefit from VATS procedure and TCTS consult was obtained.  [facility].  Hospital Course:   [facility] he was evaluated by Dr. Laneta SimmersBartle who was in  agreement the patient would benefit from VATS procedure.  The risks and benefits of the procedure were explained to the patient and he was agreeable to proceed.  He was taken to the operating room on 05/13/2016.  He underwent Left VATS with muscle sparing thoracotomy with drainage of empyema, decortication of the left lung, and debridement of a left upper lobe lung abscess.  He tolerated the procedure, was extubated, and taken to the SICU in stable condition.  During his stay in the SICU the patient was placed on Rocephin after OR gram stains showed abundant gram positive cocci in chains.  His anterior chest tube was removed on POD #2.  Follow up CXR was stable and free from pneumothorax.  His final chest tube had minimal drainage and was removed on POD #3.  Again follow up CXR was free from pneumothorax.  His pain is controlled with oral pain medication.  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  He has received several doses of IV Rocephin and will be treated with a 14 day course of oral Augmentin  He is ambulating without difficulty.  His pain remains well controlled.  He is felt medically stable for discharge home today.     Significant Diagnostic Studies: radiology:   CT scan: 1. Large partially loculated left pleural effusion. This cause of complete collapse/atelectasis of the left lower lobe and moderate left upper lobe atelectasis. This limits assessment of the underlying lung parenchyma. Underlying pneumonia or pulmonary mass cannot be assessed. Recommend follow-up imaging after drainage or resolution of pleural fluid. 2. No focal abnormality in the  right lung.  Treatments: surgery:   Procedure:  1. Left video-assisted thoracoscopy 2.Left muscle-sparing thoracotomy  3. Drainage of empyema  4. Decortication of the left lung  5. Debridement of left upper lobe lung abscess.  Disposition: Home  Discharge Medications:     Medication List    STOP taking  these medications        levofloxacin 750 MG tablet  Commonly known as:  LEVAQUIN      TAKE these medications        acetaminophen 500 MG tablet  Commonly known as:  TYLENOL  Take 2 tablets (1,000 mg total) by mouth every 6 (six) hours as needed for mild pain or fever.     amLODipine-valsartan 5-160 MG tablet  Commonly known as:  EXFORGE  Take 1 tablet by mouth daily.     amoxicillin-clavulanate 875-125 MG tablet  Commonly known as:  AUGMENTIN  Take 1 tablet by mouth 2 (two) times daily. For 14 days     dextromethorphan-guaiFENesin 30-600 MG 12hr tablet  Commonly known as:  MUCINEX DM  Take 1 tablet by mouth 2 (two) times daily as needed for cough.     HYDROcodone-homatropine 5-1.5 MG/5ML syrup  Commonly known as:  HYCODAN  Take 5 mLs by mouth at bedtime as needed.     ibuprofen 200 MG tablet  Commonly known as:  ADVIL,MOTRIN  Take 200 mg by mouth every 6 (six) hours as needed for mild pain or moderate pain.     LIPITOR 10 MG tablet  Generic drug:  atorvastatin  Take 10 mg by mouth daily.     oxyCODONE 5 MG immediate release tablet  Commonly known as:  Oxy IR/ROXICODONE  Take 1-2 tablets (5-10 mg total) by mouth every 4 (four) hours as needed for severe pain.       Follow-up Information    Follow up with Alleen Borne, MD On 06/02/2016.   Specialty:  Cardiothoracic Surgery   Why:  Appointment is at 4:30    Contact information:   [address] [place] [address] [phone]       Follow up with [facility] On 06/02/2016.   Why:  Please get CXR at 4:00, prior to appointment with Dr. Cristy Hilts information:   [facility]       Follow up with [facility] On 05/27/2016.   Specialty:  Cardiothoracic Surgery   Why:  Appointment is at 10:00, For suture removal   Contact information:   [address] [place], [address] [place] [address] [phone]      Signed: Lowella Dandy 05/18/2016, 8:10 AM

## 2016-05-17 NOTE — Progress Notes (Addendum)
      [address]       [place] [id number]             [phone]      4 Days Post-Op Procedure(s) (LRB): LEFT VIDEO ASSISTED THORACOSCOPY (VATS)/DECORTICATION (Left) DRAINAGE OF LEFT PLEURAL EFFUSION (Left)   Subjective:  Allen Becker is doing okay.  Having a hard time getting comfortable.  + ambulation  + BM  Objective: Vital signs in last 24 hours: Temp:  [97.3 F (36.3 C)-98 F (36.7 C)] 97.4 F (36.3 C) (06/05 0507) Pulse Rate:  [71-89] 73 (06/05 0507) Cardiac Rhythm:  [-] Normal sinus rhythm (06/04 2050) Resp:  [16-26] 20 (06/05 0507) BP: (123-167)/(84-106) 145/96 mmHg (06/05 0507) SpO2:  [95 %-100 %] 95 % (06/05 0507) Weight:  [165 lb 9.1 oz (75.1 kg)] 165 lb 9.1 oz (75.1 kg) (06/04 2042)  Intake/Output from previous day: 06/04 0701 - 06/05 0700 In: 1110 [P.O.:960; I.V.:100; IV Piggyback:50] Out: 2200 [Urine:1850; Emesis/NG output:350]  General appearance: alert, cooperative and no distress Heart: regular rate and rhythm Lungs: diminished breath sounds left base Abdomen: soft, non-tender; bowel sounds normal; no masses,  no organomegaly Extremities: extremities normal, atraumatic, no cyanosis or edema Wound: staples in place, ecchymosis present, wound is warm and swollen, no acute signs of infection  Lab Results:  Recent Labs  05/15/16 0320 05/17/16 0315  WBC 16.5* 10.3  HGB 9.7* 9.7*  HCT 30.6* 30.9*  PLT 504* 602*   BMET:  Recent Labs  05/16/16 0313 05/17/16 0315  NA 137 138  K 3.8 4.1  CL 100* 102  CO2 29 29  GLUCOSE 127* 107*  BUN 11 8  CREATININE 1.18 1.08  CALCIUM 7.9* 8.6*    PT/INR: No results for input(s): LABPROT, INR in the last 72 hours. ABG    Component Value Date/Time   PHART 7.453* 05/14/2016 0348   HCO3 27.9* 05/14/2016 0348   TCO2 29.1 05/14/2016 0348   O2SAT 94.3 05/14/2016 0348   CBG (last 3)   Recent Labs  05/14/16 2354 05/15/16 0326 05/15/16 0759  GLUCAP 117* 139* 114*     Assessment/Plan: S/P Procedure(s) (LRB): LEFT VIDEO ASSISTED THORACOSCOPY (VATS)/DECORTICATION (Left) DRAINAGE OF LEFT PLEURAL EFFUSION (Left)  1. Empyema- final chest tube removed yesterday, CXR without pneumothorax, expected post surgical changes present 2. ID- remains afebrile, leukocytosis improved, on Rocehpin, will discuss oral ABX for discharge 3. Dispo- patient stable, continue abx for now, discuss wound edema with Dr. Laneta SimmersBartle, possibly ready for d/c home today vs. tomorrow   LOS: 8 days    Lowella DandyBARRETT, ERIN 05/17/2016   Chart reviewed, patient examined, agree with above. He has a seroma in the subcutaneous plane over the muscles. I don't think it is infected with no fever and normalization of the WBC ct. The incision seems to be healing well. I would follow this since they usually resolve on their own. I think he can go home tomorrow if no changes. I will see back in the office next week to decide about staple removal.

## 2016-05-18 LAB — CULTURE, BODY FLUID W GRAM STAIN -BOTTLE: Culture: NO GROWTH

## 2016-05-18 LAB — AEROBIC/ANAEROBIC CULTURE W GRAM STAIN (SURGICAL/DEEP WOUND)

## 2016-05-18 LAB — AEROBIC/ANAEROBIC CULTURE (SURGICAL/DEEP WOUND): CULTURE: NO GROWTH

## 2016-05-18 LAB — CULTURE, BODY FLUID-BOTTLE

## 2016-05-18 MED ORDER — DM-GUAIFENESIN ER 30-600 MG PO TB12: 1.0000 | ORAL_TABLET | Freq: Two times a day (BID) | ORAL | Status: DC | PRN

## 2016-05-18 MED ORDER — AMOXICILLIN-POT CLAVULANATE 875-125 MG PO TABS: 1.0000 | ORAL_TABLET | Freq: Two times a day (BID) | ORAL | Status: DC

## 2016-05-18 MED ORDER — OXYCODONE HCL 5 MG PO TABS: 5.0000 mg | ORAL_TABLET | ORAL | Status: DC | PRN

## 2016-05-18 MED ORDER — ACETAMINOPHEN 500 MG PO TABS: 1000.0000 mg | ORAL_TABLET | Freq: Four times a day (QID) | ORAL | Status: AC | PRN

## 2016-05-18 NOTE — Care Management Note (Signed)
Case Management Note Previous CM note initiated by [place], Chrystine OilerSharley Diane, RN  Patient Details  Name: Allen Becker MRN: [id number] Date of Birth: 22-Dec-1965  Subjective/Objective: Patient is from home, Has a teenage son that lives with him. Patient is independent with ADL's. Reports he drives himself to appointments and has no issues obtaining meds.                     Action/Plan: No CM needs at this time, but will cont. To follow.    Expected Discharge Date:     05/18/16             Expected Discharge Plan:  Home/Self Care  In-House Referral:     Discharge planning Services  CM Consult  Post Acute Care Choice:  NA Choice offered to:  NA  DME Arranged:    DME Agency:     HH Arranged:    HH Agency:     Status of Service:  Completed, signed off  Medicare Important Message Given:    Date Medicare IM Given:    Medicare IM give by:    Date Additional Medicare IM Given:    Additional Medicare Important Message give by:     If discussed at Long Length of Stay Meetings, dates discussed:    Additional Comments:  Darrold SpanWebster, Taccara Bushnell Hall, RN 05/18/2016, 10:21 AM [phone]

## 2016-05-18 NOTE — Progress Notes (Signed)
Discharge orders received, IV and tele removed.  CCMD notified.  Pt going to get dressed, notify his family to come pick him up.  Should be able to discharge him around 10:00am.

## 2016-05-18 NOTE — Progress Notes (Signed)
Utilization review completed.  

## 2016-05-18 NOTE — Discharge Instructions (Signed)
Thoracotomy, Care After Refer to this sheet in the next few weeks. These instructions provide you with information on caring for yourself after your procedure. Your health care provider may also give you more specific instructions. Your treatment has been planned according to current medical practices, but problems sometimes occur. Call your health care provider if you have any problems or questions after your procedure. WHAT TO EXPECT AFTER YOUR PROCEDURE After your procedure, it is typical to have the following sensations:  You may feel pain at the incision site.  You may be constipated from the pain medicine given and the change in your level of activity.  You may feel extremely tired. HOME CARE INSTRUCTIONS  Take over-the-counter or prescription medicines for pain, discomfort, or fever only as directed by your health care provider. It is very important to take pain relieving medicine before your pain becomes severe. You will be able to breathe and cough more comfortably if your pain is well controlled.  Take deep breaths. Deep breathing helps to keep your lungs inflated and protects against a lung infection (pneumonia).  Cough frequently. Even though coughing may cause discomfort, coughing is important to clear mucus (phlegm) and expand your lungs. Coughing helps prevent pneumonia. If it hurts to cough, hold a pillow against your chest when you cough. This may help with the discomfort.  Continue to use an incentive spirometer as directed. The use of an incentive spirometer helps to keep your lungs inflated and protects against pneumonia.  Change the bandages over your incision as needed or as directed by your health care provider.  Remove the bandages over your chest tube site as directed by your health care provider.  Resume your normal diet as directed. It is important to have adequate protein, calories, vitamins, and minerals to promote healing.  Prevent constipation.  Eat  high-fiber foods such as whole grain cereals and breads, brown rice, beans, and fresh fruits and vegetables.  Drink enough water and fluids to keep your urine clear or pale yellow. Avoid drinking beverages containing caffeine. Beverages containing caffeine can cause dehydration and harden your stool.  Talk to your health care provider about taking a stool softener or laxative.  Avoid lifting until you are instructed otherwise.  Do not drive until directed by your health care provider.  Do not drive while taking pain medicines (narcotics).  Do not bathe, swim, or use a hot tub until directed by your health care provider. You may shower instead. Gently wash the area of your incision with water and soap as directed. Do not use anything else to clean your incision except as directed by your health care provider.  Do not use any tobacco products including cigarettes, chewing tobacco, or electronic cigarettes.  Avoid secondhand smoke.  Schedule an appointment for stitch (suture) or staple removal as directed.  Schedule and attend all follow-up visits as directed by your health care provider. It is important to keep all your appointments.  Participate in pulmonary rehabilitation as directed by your health care provider.  Do not travel by airplane for 2 weeks after your chest tube is removed. SEEK MEDICAL CARE IF:  You are bleeding from your wounds.  Your heartbeat seems irregular.  You have redness, swelling, or increasing pain in the wounds.  There is pus coming from your wounds.  There is a bad smell coming from the wound or dressing.  You have a fever or chills.  You have nausea or are vomiting.  You have muscle aches. SEEK  IMMEDIATE MEDICAL CARE IF:  You have a rash.  You have difficulty breathing.  You have a reaction or side effect to medicines given.  You have persistent nausea.  You have lightheadedness or feel faint.  You have shortness of breath or chest  pain.  You have persistent pain.   This information is not intended to replace advice given to you by your health care provider. Make sure you discuss any questions you have with your health care provider.   Document Released: 05/14/2011 Document Revised: 04/15/2015 Document Reviewed: 07/18/2013 Elsevier Interactive Patient Education 2016 Elsevier Inc.   Seroma A seroma is a collection of fluid that looks like swelling or a mass on the body. Seromas form on the body where tissue has been injured or cut. They are most common after surgeries. Seromas vary in size. Some are small and painless. Others may become large and cause pain or discomfort. Many seromas go away on their own; the fluid is naturally absorbed by the body. Some may require the fluid to be drained through medical procedures.  CAUSES  Seromas form as the result of damage to tissue or the removal of tissue. This tissue damage may occur during surgery or because of an injury or trauma. When tissue is disrupted or removed, empty space is created. The body's natural defense system causes fluid to enter the empty space and form a seroma. SYMPTOMS   Swelling at the site of a surgical cut (incision) or an injury.  Drainage of clear fluid at the surgery or injury site.  Possible discomfort or pain. DIAGNOSIS  Your health care provider will perform a physical exam. During the exam, the health care provider will press on the seroma using a hand or fingers (palpation). Various tests may be ordered to help confirm the diagnosis. These tests may include:  Blood tests.  Imaging tests such as ultrasonography or computed tomography (CT). TREATMENT  Sometimes seromas resolve on their own and drain naturally in the body. Your health care provider may monitor you to make sure the seroma does not cause any complications. If your seroma does not resolve on its own, treatment may include:  Using a needle to drain the fluid from the seroma  (needle aspiration).  Inserting a flexible tube (catheter) to drain the fluid.  Applying a dressing, such as an elastic bandage or binder.  Use of antibiotic medicines if the seroma becomes infected.  In rare cases, surgery may be done to remove the seroma and repair the area. HOME CARE INSTRUCTIONS  Follow your health care provider's instructions regarding activity levels and any limitations on movements.  Only take over-the-counter or prescription medicines as directed by your health care provider.  If your health care provider prescribes antibiotics, take them as directed. Finish them even if you start to feel better.  Check your seroma every day for redness, warmth, or yellow drainage.  Follow up with your health care provider as directed. SEEK MEDICAL CARE IF:  You develop a fever.  You have pain, tenderness, redness, or warmth at the site of the seroma.  You notice yellow drainage coming from the site of the seroma.  Your seroma is getting bigger.   This information is not intended to replace advice given to you by your health care provider. Make sure you discuss any questions you have with your health care provider.   Document Released: 03/26/2013 Document Revised: 12/20/2014 Document Reviewed: 03/26/2013 Elsevier Interactive Patient Education [employer] Elsevier Inc.

## 2016-05-18 NOTE — Progress Notes (Signed)
      [address]       [place] [id number]             [phone]      5 Days Post-Op Procedure(s) (LRB): LEFT VIDEO ASSISTED THORACOSCOPY (VATS)/DECORTICATION (Left) DRAINAGE OF LEFT PLEURAL EFFUSION (Left)   Subjective:  Mr. Allen Becker has no complaints.  States he is feeling pretty good this morning.  Ready to go home.  Objective: Vital signs in last 24 hours: Temp:  [97.6 F (36.4 C)-98.3 F (36.8 C)] 97.6 F (36.4 C) (06/06 0500) Pulse Rate:  [75-80] 79 (06/06 0500) Cardiac Rhythm:  [-] Normal sinus rhythm (06/05 2100) Resp:  [18] 18 (06/06 0500) BP: (130-183)/(90-109) 152/98 mmHg (06/06 0546) SpO2:  [98 %-100 %] 98 % (06/06 0500)  General appearance: alert, cooperative and no distress Heart: regular rate and rhythm Lungs: diminished breath sounds left base Abdomen: soft, non-tender; bowel sounds normal; no masses,  no organomegaly Extremities: extremities normal, atraumatic, no cyanosis or edema Wound: clean and dry, seroma present, staples in place  Lab Results:  Recent Labs  05/17/16 0315  WBC 10.3  HGB 9.7*  HCT 30.9*  PLT 602*   BMET:  Recent Labs  05/16/16 0313 05/17/16 0315  NA 137 138  K 3.8 4.1  CL 100* 102  CO2 29 29  GLUCOSE 127* 107*  BUN 11 8  CREATININE 1.18 1.08  CALCIUM 7.9* 8.6*    PT/INR: No results for input(s): LABPROT, INR in the last 72 hours. ABG    Component Value Date/Time   PHART 7.453* 05/14/2016 0348   HCO3 27.9* 05/14/2016 0348   TCO2 29.1 05/14/2016 0348   O2SAT 94.3 05/14/2016 0348   CBG (last 3)  No results for input(s): GLUCAP in the last 72 hours.  Assessment/Plan: S/P Procedure(s) (LRB): LEFT VIDEO ASSISTED THORACOSCOPY (VATS)/DECORTICATION (Left) DRAINAGE OF LEFT PLEURAL EFFUSION (Left)  1. CV- H/o HTN, will resume home BP medications as discharge 2. 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 ID- wound seroma, non infected, should improve with time, will monitor 4. Dispo- patient stable, will d/c home today   LOS: 9 days    BARRETT, ERIN 05/18/2016

## 2016-05-23 NOTE — Anesthesia Postprocedure Evaluation (Signed)
Anesthesia Post Note  Patient: Allen Becker  Procedure(s) Performed: Procedure(s) (LRB): LEFT VIDEO ASSISTED THORACOSCOPY (VATS)/DECORTICATION (Left) DRAINAGE OF LEFT PLEURAL EFFUSION (Left)  Patient location during evaluation: PACU Anesthesia Type: General Level of consciousness: sedated and patient cooperative Pain management: pain level controlled Vital Signs Assessment: post-procedure vital signs reviewed and stable Respiratory status: spontaneous breathing Cardiovascular status: stable Anesthetic complications: no    Last Vitals:  Filed Vitals:   05/18/16 0500 05/18/16 0546  BP: 183/109 152/98  Pulse: 79   Temp: 36.4 C   Resp: 18     Last Pain:  Filed Vitals:   05/18/16 0546  PainSc: 0-No pain                 Allen Becker

## 2016-05-27 ENCOUNTER — Other Ambulatory Visit: Payer: Self-pay | Admitting: Surgery

## 2016-05-27 ENCOUNTER — Ambulatory Visit (INDEPENDENT_AMBULATORY_CARE_PROVIDER_SITE_OTHER): Payer: Self-pay | Admitting: *Deleted

## 2016-05-27 DIAGNOSIS — J90 Pleural effusion, not elsewhere classified: Secondary | ICD-10-CM

## 2016-05-27 DIAGNOSIS — Z09 Encounter for follow-up examination after completed treatment for conditions other than malignant neoplasm: Secondary | ICD-10-CM

## 2016-05-27 DIAGNOSIS — Z9889 Other specified postprocedural states: Secondary | ICD-10-CM

## 2016-05-27 DIAGNOSIS — Z4802 Encounter for removal of sutures: Secondary | ICD-10-CM

## 2016-05-27 DIAGNOSIS — J869 Pyothorax without fistula: Secondary | ICD-10-CM

## 2016-05-27 NOTE — Progress Notes (Signed)
Mr. [name] returns s/p L VATS for left empyema. He continues to have a rather significant hematoma of his left thoracotomy stapled incision, but it is very well healed.  There is no redness to this site or drainage. The chest tube sites are well healed and the sutures were easily removed. I had Dr. Tyrone SageGerhardt observe his incision before I removed his staples. He advised that a picture be taken and sent to Dr. Laneta SimmersBartle in case he wanted to see Mr. [name] before his scheduled appointment next week.  This was done. In reviewing a picture that was done before his discharge, the area has decreased in size somewhat. The staples were easily removed with old blood from the hematoma oozing from the staple holes.  A dressing was placed for today. He will notify us of any changes. Otherwise we see him next week as scheduled with a chest xray.

## 2016-06-02 ENCOUNTER — Ambulatory Visit (INDEPENDENT_AMBULATORY_CARE_PROVIDER_SITE_OTHER): Payer: BLUE CROSS/BLUE SHIELD | Admitting: Surgery

## 2016-06-02 ENCOUNTER — Ambulatory Visit
Admission: RE | Admit: 2016-06-02 | Discharge: 2016-06-02 | Disposition: A | Discharge status: Home / Self Care | Payer: BLUE CROSS/BLUE SHIELD | Source: Ambulatory Visit | Attending: Surgery | Admitting: Surgery

## 2016-06-02 ENCOUNTER — Encounter: Payer: Self-pay | Admitting: Surgery

## 2016-06-02 VITALS — BP 155/100 | HR 94 | Resp 20 | Ht 66.0 in | Wt 165.0 lb

## 2016-06-02 DIAGNOSIS — J869 Pyothorax without fistula: Secondary | ICD-10-CM

## 2016-06-02 DIAGNOSIS — J90 Pleural effusion, not elsewhere classified: Secondary | ICD-10-CM

## 2016-06-02 DIAGNOSIS — Z09 Encounter for follow-up examination after completed treatment for conditions other than malignant neoplasm: Secondary | ICD-10-CM

## 2016-06-02 NOTE — Progress Notes (Signed)
Unable to reach pt by phone, no voice mail available. Left pre-op instructions on pt's emergency contact's # [facility]([place]).

## 2016-06-03 ENCOUNTER — Other Ambulatory Visit: Payer: Self-pay

## 2016-06-03 ENCOUNTER — Encounter (HOSPITAL_COMMUNITY): Payer: Self-pay | Admitting: *Deleted

## 2016-06-03 ENCOUNTER — Inpatient Hospital Stay (HOSPITAL_COMMUNITY): Payer: BLUE CROSS/BLUE SHIELD | Admitting: Certified Registered"

## 2016-06-03 ENCOUNTER — Encounter: Payer: Self-pay | Admitting: Surgery

## 2016-06-03 ENCOUNTER — Encounter (HOSPITAL_COMMUNITY)
Admission: RE | Disposition: A | Discharge status: Home / Self Care | Payer: Self-pay | Source: Ambulatory Visit | Attending: Surgery

## 2016-06-03 ENCOUNTER — Ambulatory Visit (HOSPITAL_COMMUNITY)
Admission: RE | Admit: 2016-06-03 | Discharge: 2016-06-03 | Disposition: A | Discharge status: Home / Self Care | Payer: BLUE CROSS/BLUE SHIELD | Source: Ambulatory Visit | Attending: Surgery | Admitting: Surgery

## 2016-06-03 DIAGNOSIS — L7632 Postprocedural hematoma of skin and subcutaneous tissue following other procedure: Secondary | ICD-10-CM | POA: Insufficient documentation

## 2016-06-03 DIAGNOSIS — I97638 Postprocedural hematoma of a circulatory system organ or structure following other circulatory system procedure: Secondary | ICD-10-CM | POA: Diagnosis not present

## 2016-06-03 DIAGNOSIS — Z79899 Other long term (current) drug therapy: Secondary | ICD-10-CM | POA: Diagnosis not present

## 2016-06-03 DIAGNOSIS — I10 Essential (primary) hypertension: Secondary | ICD-10-CM | POA: Insufficient documentation

## 2016-06-03 DIAGNOSIS — T148XXA Other injury of unspecified body region, initial encounter: Secondary | ICD-10-CM

## 2016-06-03 DIAGNOSIS — Z87891 Personal history of nicotine dependence: Secondary | ICD-10-CM | POA: Diagnosis not present

## 2016-06-03 HISTORY — PX: WOUND EXPLORATION: SHX6188

## 2016-06-03 HISTORY — DX: Gastro-esophageal reflux disease without esophagitis: K21.9

## 2016-06-03 HISTORY — DX: Pneumonia, unspecified organism: J18.9

## 2016-06-03 LAB — BASIC METABOLIC PANEL
Anion gap: 10 (ref 5–15)
BUN: 14 mg/dL (ref 6–20)
CALCIUM: 9.6 mg/dL (ref 8.9–10.3)
CO2: 25 mmol/L (ref 22–32)
CREATININE: 0.93 mg/dL (ref 0.61–1.24)
Chloride: 105 mmol/L (ref 101–111)
GFR calc non Af Amer: >60 mL/min (ref >60)
Glucose, Bld: 96 mg/dL (ref 65–99)
Potassium: 4.2 mmol/L (ref 3.5–5.1)
SODIUM: 140 mmol/L (ref 135–145)

## 2016-06-03 LAB — CBC
HCT: 41.5 % (ref 39.0–52.0)
Hemoglobin: 13.3 g/dL (ref 13.0–17.0)
MCH: 28 pg (ref 26.0–34.0)
MCHC: 32 g/dL (ref 30.0–36.0)
MCV: 87.4 fL (ref 78.0–100.0)
PLATELETS: 380 10*3/uL (ref 150–400)
RBC: 4.75 MIL/uL (ref 4.22–5.81)
RDW: 13.6 % (ref 11.5–15.5)
WBC: 8.4 10*3/uL (ref 4.0–10.5)

## 2016-06-03 SURGERY — WOUND EXPLORATION
Anesthesia: General | Site: Chest | Laterality: Left

## 2016-06-03 SURGERY — Surgical Case
Anesthesia: *Unknown

## 2016-06-03 MED ORDER — PHENYLEPHRINE 40 MCG/ML (10ML) SYRINGE FOR IV PUSH (FOR BLOOD PRESSURE SUPPORT)
PREFILLED_SYRINGE | INTRAVENOUS | Status: AC
  Filled 2016-06-03: qty 10

## 2016-06-03 MED ORDER — ONDANSETRON HCL 4 MG/2ML IJ SOLN
INTRAMUSCULAR | Status: DC | PRN
  Administered 2016-06-03: 4 mg via INTRAVENOUS

## 2016-06-03 MED ORDER — PHENYLEPHRINE HCL 10 MG/ML IJ SOLN
INTRAMUSCULAR | Status: DC | PRN
  Administered 2016-06-03 (×7): 40 ug via INTRAVENOUS

## 2016-06-03 MED ORDER — PROPOFOL 10 MG/ML IV BOLUS
INTRAVENOUS | Status: AC
  Filled 2016-06-03: qty 20

## 2016-06-03 MED ORDER — MIDAZOLAM HCL 5 MG/5ML IJ SOLN
INTRAMUSCULAR | Status: DC | PRN
  Administered 2016-06-03 (×2): 1 mg via INTRAVENOUS

## 2016-06-03 MED ORDER — FENTANYL CITRATE (PF) 250 MCG/5ML IJ SOLN
INTRAMUSCULAR | Status: AC
  Filled 2016-06-03: qty 5

## 2016-06-03 MED ORDER — DEXTROSE 5 % IV SOLN
INTRAVENOUS | Status: AC
  Filled 2016-06-03: qty 1.5

## 2016-06-03 MED ORDER — ROCURONIUM BROMIDE 50 MG/5ML IV SOLN
INTRAVENOUS | Status: AC
  Filled 2016-06-03: qty 1

## 2016-06-03 MED ORDER — 0.9 % SODIUM CHLORIDE (POUR BTL) OPTIME
TOPICAL | Status: DC | PRN
  Administered 2016-06-03: 1000 mL

## 2016-06-03 MED ORDER — MIDAZOLAM HCL 2 MG/2ML IJ SOLN
INTRAMUSCULAR | Status: AC
  Filled 2016-06-03: qty 2

## 2016-06-03 MED ORDER — CEPHALEXIN 500 MG PO CAPS: 500.0000 mg | ORAL_CAPSULE | Freq: Four times a day (QID) | ORAL | Status: DC

## 2016-06-03 MED ORDER — PROPOFOL 10 MG/ML IV BOLUS
INTRAVENOUS | Status: DC | PRN
  Administered 2016-06-03: 50 mg via INTRAVENOUS
  Administered 2016-06-03: 200 mg via INTRAVENOUS
  Administered 2016-06-03: 30 mg via INTRAVENOUS

## 2016-06-03 MED ORDER — LACTATED RINGERS IV SOLN
INTRAVENOUS | Status: DC
  Administered 2016-06-03: 10:00:00 via INTRAVENOUS

## 2016-06-03 MED ORDER — DEXTROSE 5 % IV SOLN
1.5000 g | INTRAVENOUS | Status: AC
  Administered 2016-06-03: 1.5 g via INTRAVENOUS

## 2016-06-03 MED ORDER — FENTANYL CITRATE (PF) 100 MCG/2ML IJ SOLN
INTRAMUSCULAR | Status: DC | PRN
  Administered 2016-06-03: 50 ug via INTRAVENOUS
  Administered 2016-06-03 (×2): 25 ug via INTRAVENOUS
  Administered 2016-06-03: 75 ug via INTRAVENOUS
  Administered 2016-06-03: 50 ug via INTRAVENOUS
  Administered 2016-06-03: 25 ug via INTRAVENOUS

## 2016-06-03 SURGICAL SUPPLY — 78 items
ADH SKN CLS APL DERMABOND .7 (GAUZE/BANDAGES/DRESSINGS) ×1
APL SRG 22X2 LUM MLBL SLNT (VASCULAR PRODUCTS)
APPLICATOR TIP EXT COSEAL (VASCULAR PRODUCTS) IMPLANT
APPLIER CLIP LOGIC TI 5 (MISCELLANEOUS) IMPLANT
APPLIER CLIP ROT 10 11.4 M/L (STAPLE)
APR CLP MED LRG 11.4X10 (STAPLE)
APR CLP MED LRG 33X5 (MISCELLANEOUS)
BIT DRILL 7/64X5 DISP (BIT) ×1 IMPLANT
BLADE OSCILLATING /SAGITTAL (BLADE) ×1 IMPLANT
BOWL SMART MIX CTS (DISPOSABLE) IMPLANT
CANISTER SUCTION 2500CC (MISCELLANEOUS) ×3 IMPLANT
CATH HYDRAGLIDE XL THORACIC (CATHETERS) IMPLANT
CATH THORACIC 28FR (CATHETERS) IMPLANT
CATH THORACIC 36FR (CATHETERS) IMPLANT
CATH THORACIC 36FR RT ANG (CATHETERS) IMPLANT
CLIP APPLIE ROT 10 11.4 M/L (STAPLE) IMPLANT
CONN Y 3/8X3/8X3/8  BEN (MISCELLANEOUS)
CONN Y 3/8X3/8X3/8 BEN (MISCELLANEOUS) IMPLANT
CONT SPEC 4OZ CLIKSEAL STRL BL (MISCELLANEOUS) ×2 IMPLANT
COVER SURGICAL LIGHT HANDLE (MISCELLANEOUS) ×6 IMPLANT
DECANTER SPIKE VIAL GLASS SM (MISCELLANEOUS) ×3 IMPLANT
DERMABOND ADVANCED (GAUZE/BANDAGES/DRESSINGS) ×2
DERMABOND ADVANCED .7 DNX12 (GAUZE/BANDAGES/DRESSINGS) ×1 IMPLANT
DRAIN CHANNEL 28F RND 3/8 FF (WOUND CARE) IMPLANT
DRAPE CHEST BREAST 15X10 FENES (DRAPES) ×3 IMPLANT
ELECT REM PT RETURN 9FT ADLT (ELECTROSURGICAL) ×3
ELECTRODE REM PT RTRN 9FT ADLT (ELECTROSURGICAL) ×1 IMPLANT
GAUZE SPONGE 4X4 12PLY STRL (GAUZE/BANDAGES/DRESSINGS) ×3 IMPLANT
GLOVE BIO SURGEON STRL SZ 6.5 (GLOVE) ×2 IMPLANT
GLOVE BIO SURGEONS STRL SZ 6.5 (GLOVE) ×2
GLOVE SURG SIGNA 7.5 PF LTX (GLOVE) ×3 IMPLANT
GOWN STRL REUS W/ TWL LRG LVL3 (GOWN DISPOSABLE) ×2 IMPLANT
GOWN STRL REUS W/ TWL XL LVL3 (GOWN DISPOSABLE) ×1 IMPLANT
GOWN STRL REUS W/TWL LRG LVL3 (GOWN DISPOSABLE) ×6
GOWN STRL REUS W/TWL XL LVL3 (GOWN DISPOSABLE) ×3
HEMOSTAT SURGICEL 2X14 (HEMOSTASIS) IMPLANT
KIT BASIN OR (CUSTOM PROCEDURE TRAY) ×3 IMPLANT
KIT ROOM TURNOVER OR (KITS) ×3 IMPLANT
NS IRRIG 1000ML POUR BTL (IV SOLUTION) ×6 IMPLANT
PACK CHEST (CUSTOM PROCEDURE TRAY) ×3 IMPLANT
PAD ARMBOARD 7.5X6 YLW CONV (MISCELLANEOUS) ×6 IMPLANT
SAW GIGLI STERILE 20 (MISCELLANEOUS) ×3 IMPLANT
SEALANT PROGEL (MISCELLANEOUS) IMPLANT
SEALANT SURG COSEAL 4ML (VASCULAR PRODUCTS) IMPLANT
SEALANT SURG COSEAL 8ML (VASCULAR PRODUCTS) IMPLANT
SPECIMEN JAR MEDIUM (MISCELLANEOUS) ×1 IMPLANT
SPONGE GAUZE 4X4 12PLY STER LF (GAUZE/BANDAGES/DRESSINGS) ×2 IMPLANT
SPONGE TONSIL 1 RF SGL (DISPOSABLE) ×1 IMPLANT
STAPLER VISISTAT 35W (STAPLE) IMPLANT
SUT PROLENE 0 CT 1 CR/8 (SUTURE) IMPLANT
SUT PROLENE 1 CT (SUTURE) IMPLANT
SUT PROLENE 1 MO 6 (SUTURE) IMPLANT
SUT PROLENE 1 XLH 60 (SUTURE) IMPLANT
SUT PROLENE 2 0 CR (SUTURE) IMPLANT
SUT PROLENE 2 TP 1 (SUTURE) IMPLANT
SUT PROLENE 3 0 SH DA (SUTURE) IMPLANT
SUT PROLENE 4 0 RB 1 (SUTURE)
SUT PROLENE 4-0 RB1 .5 CRCL 36 (SUTURE) IMPLANT
SUT SILK 0 FSL (SUTURE) ×6 IMPLANT
SUT SILK 2 0SH CR/8 30 (SUTURE) ×3 IMPLANT
SUT VIC AB 1 CTX 18 (SUTURE) ×3 IMPLANT
SUT VIC AB 1 CTX 36 (SUTURE) ×3
SUT VIC AB 1 CTX36XBRD ANBCTR (SUTURE) ×1 IMPLANT
SUT VIC AB 2-0 CTX 36 (SUTURE) ×3 IMPLANT
SUT VIC AB 3-0 MH 27 (SUTURE) IMPLANT
SUT VIC AB 3-0 SH 18 (SUTURE) ×2 IMPLANT
SUT VIC AB 3-0 SH 27 (SUTURE) ×3
SUT VIC AB 3-0 SH 27X BRD (SUTURE) IMPLANT
SUT VIC AB 3-0 X1 27 (SUTURE) ×3 IMPLANT
SUT VICRYL 2 TP 1 (SUTURE) ×3 IMPLANT
SYSTEM SAHARA CHEST DRAIN RE-I (WOUND CARE) ×3 IMPLANT
TAPE CLOTH 4X10 WHT NS (GAUZE/BANDAGES/DRESSINGS) ×3 IMPLANT
TAPE CLOTH SURG 4X10 WHT LF (GAUZE/BANDAGES/DRESSINGS) ×2 IMPLANT
TAPE UMBILICAL COTTON 1/8X30 (MISCELLANEOUS) IMPLANT
TOWEL OR 17X26 10 PK STRL BLUE (TOWEL DISPOSABLE) ×3 IMPLANT
TRAY FOLEY W/METER SILVER 16FR (SET/KITS/TRAYS/PACK) ×1 IMPLANT
TUNNELER SHEATH ON-Q 11GX8 DSP (PAIN MANAGEMENT) IMPLANT
WATER STERILE IRR 1000ML POUR (IV SOLUTION) ×4 IMPLANT

## 2016-06-03 NOTE — Anesthesia Procedure Notes (Signed)
Procedure Name: LMA Insertion Date/Time: 06/03/2016 1:12 PM Performed by: Daiva EvesAVENEL, Zarie Kosiba W Pre-anesthesia Checklist: Patient identified, Timeout performed, Emergency Drugs available, Suction available and Patient being monitored Patient Re-evaluated:Patient Re-evaluated prior to inductionOxygen Delivery Method: Circle system utilized Preoxygenation: Pre-oxygenation with 100% oxygen Intubation Type: IV induction LMA: LMA inserted LMA Size: 5.0 Tube type: Oral Number of attempts: 1 Placement Confirmation: positive ETCO2,  CO2 detector and breath sounds checked- equal and bilateral Tube secured with: Tape Dental Injury: Teeth and Oropharynx as per pre-operative assessment

## 2016-06-03 NOTE — Transfer of Care (Signed)
Immediate Anesthesia Transfer of Care Note  Patient: Allen Becker  Procedure(s) Performed: Procedure(s): I&D LEFT CHEST WALL INCISIONAL HERNIA (Left)  Patient Location: PACU  Anesthesia Type:General  Level of Consciousness: awake, alert  and oriented  Airway & Oxygen Therapy: Patient Spontanous Breathing  Post-op Assessment: Report given to RN and Post -op Vital signs reviewed and stable  Post vital signs: Reviewed and stable  Last Vitals:  Filed Vitals:   06/03/16 1018  BP: 160/99  Pulse: 76  Temp: 36.8 C  Resp: 20    Last Pain: There were no vitals filed for this visit.    Patients Stated Pain Goal: 2 (06/03/16 1021)  Complications: No apparent anesthesia complications

## 2016-06-03 NOTE — H&P (Signed)
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 The patient's early postoperative recovery while in the hospital was notable for development of some swelling beneath the left chest incision that looked like a hematoma or seroma. Since hospital discharge the patient reports that he has been doing well. He has had no fever, cough or sputum. He returned last week for staple removal and after they were removed one area of the chest incision opened and drained a small amount of liquid hematoma. He has been keeping a dressing over this area.   Current Outpatient Prescriptions  Medication Sig Dispense Refill  . acetaminophen (TYLENOL) 500 MG tablet Take 2 tablets (1,000 mg total) by mouth every 6 (six) hours as needed for mild pain or fever. 30 tablet 0  . amLODipine-valsartan (EXFORGE) 5-160 MG tablet Take 1 tablet by mouth daily. Currently taking 1/2 tab po every day  4  . atorvastatin (LIPITOR) 10 MG tablet Take 10 mg by mouth daily.    Marland Kitchen. dextromethorphan-guaiFENesin (MUCINEX DM) 30-600 MG 12hr tablet Take 1 tablet by mouth 2 (two) times daily as needed for cough.    Marland Kitchen. HYDROcodone-homatropine (HYCODAN) 5-1.5 MG/5ML syrup Take 5 mLs by mouth at bedtime as needed.    Marland Kitchen. ibuprofen (ADVIL,MOTRIN) 200 MG tablet Take 200 mg by mouth every 6 (six) hours as needed for mild pain or moderate pain.    Marland Kitchen. oxyCODONE (OXY IR/ROXICODONE) 5 MG immediate release tablet Take 1-2 tablets (5-10 mg total) by mouth every 4 (four) hours as needed for severe pain. 30 tablet 0   No current facility-administered medications for this visit.     Past Medical  History  Diagnosis Date  . Hypertension   . Allergic rhinitis   . Loculated pleural effusion 05/10/2016    Surgical history reviewed: Recent left thoracotomy for drainage of empyema, debridement of lung abscess, and decortication of left lung.  History reviewed. No pertinent family history.  Social History:  reports that he has quit smoking. He does not have any smokeless tobacco history on file. He reports that he does not drink alcohol or use illicit drugs.  Allergies: No Known Allergies      Review of Systems   Constitutional: negative HENT: Negative.  Eyes: Negative.  Respiratory: negative Negative for hemoptysis.  Cardiovascular: Negative for orthopnea, leg swelling and PND.  Gastrointestinal: Negative.  Genitourinary: Negative.  Musculoskeletal: Negative.  Skin: Negative.  Neurological: Negative.  Endo/Heme/Allergies: Negative.  Psychiatric/Behavioral: Negative.  Physical Exam: BP 155/100 mmHg  Pulse 94  Resp 20  Ht 5\' 6"  (1.676 m)  Wt 165 lb (74.844 kg)  BMI 26.64 kg/m2  SpO2 98%  Constitutional: He is oriented to person, place, and time. He appears well-developed and well-nourished.  HENT:  Head: Normocephalic and atraumatic.  Mouth/Throat: Oropharynx is clear and moist.  Eyes: Conjunctivae and EOM are normal. Pupils are equal, round, and reactive to light.  Neck: Normal range of motion. Neck supple. No thyromegaly present.  Cardiovascular: Normal rate, regular rhythm, normal heart sounds and intact distal pulses.  No murmur heard. Respiratory: Effort normal. No respiratory distress. He exhibits  no tenderness.  GI: Soft. Bowel sounds are normal. He exhibits no distension and no mass. There is no tenderness.  Musculoskeletal: Normal range of motion. He exhibits no edema.  Lymphadenopathy:   He has no cervical adenopathy.  Neurological: He is alert and oriented to person, place, and time.  Skin: Skin is warm and  dry.  Psychiatric: He has a normal mood and affect.   Diagnostic Tests:  None   Impression:  He has recovered well from a pulmonary standpoint from his lung abscess, pneumonia and empyema. He has a subcutaneous hematoma beneath the left thoracotomy incision. He had small subcutaneous skin flaps mobilized over the muscles to allow a muscle-sparing approach and must have bled into that space. I think the best option is to drain this completely and close the wound in the OR. I think if it is left alone it will likely get infected and then it will have to opened up and treated with dressing changes which will be a larger problem for him. I discussed the procedure with him including alternatives, benefits, and risks including but not limited to bleeding, infection, recurrent hematoma or seroma and he understands and agrees to proceed.   Plan:  Drainage of left chest incision hematoma and closure of wound.   Alleen BorneBryan K Kenlee Maler, MD [facility] [phone]

## 2016-06-03 NOTE — Brief Op Note (Signed)
06/03/2016  2:25 PM  PATIENT:  Lovett SoxBrian Taddeo  50 y.o. male  PRE-OPERATIVE DIAGNOSIS:  Left chest incisional hematoma POST-OPERATIVE DIAGNOSIS:  Same  PROCEDURE:  Procedure(s): I&D LEFT CHEST WALL INCISIONAL Hematoma  SURGEON:  Surgeon(s) and Role:    * Alleen BorneBryan K Haru Shaff, MD - Primary  PHYSICIAN ASSISTANT: none  ASSISTANTS: none   ANESTHESIA:   general  EBL:  Total I/O In: 750 [I.V.:750] Out: 25 [Blood:25]  BLOOD ADMINISTERED:none  DRAINS: none   LOCAL MEDICATIONS USED:  NONE  SPECIMEN:  No Specimen  DISPOSITION OF SPECIMEN:  N/A  COUNTS:  YES  TOURNIQUET:  * No tourniquets in log *  DICTATION: .Note written in EPIC  PLAN OF CARE: Discharge to home after PACU  PATIENT DISPOSITION:  PACU - hemodynamically stable.   Delay start of Pharmacological VTE agent (>24hrs) due to surgical blood loss or risk of bleeding: not applicable

## 2016-06-03 NOTE — Progress Notes (Signed)
Spoke with Dr. Laneta SimmersBartle regarding additional lab orders.  All we obtained was a cbc, & bmet.  Those are fine with the MD. All other others pertaining to labs have been discontinued.

## 2016-06-03 NOTE — Anesthesia Postprocedure Evaluation (Signed)
Anesthesia Post Note  Patient: Allen Becker  Procedure(s) Performed: Procedure(s) (LRB): I&D LEFT CHEST WALL INCISIONAL HERNIA (Left)  Patient location during evaluation: PACU Anesthesia Type: General Level of consciousness: sedated Pain management: pain level controlled Vital Signs Assessment: post-procedure vital signs reviewed and stable Respiratory status: spontaneous breathing and respiratory function stable Cardiovascular status: stable Anesthetic complications: no    Last Vitals:  Filed Vitals:   06/03/16 1430 06/03/16 1445  BP: 127/90 124/88  Pulse: 82 80  Temp:  36.6 C  Resp:      Last Pain:  Filed Vitals:   06/03/16 1457  PainSc: 0-No pain                 Cindee Mclester DANIEL

## 2016-06-03 NOTE — Progress Notes (Signed)
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 The patient's early postoperative recovery while in the hospital was notable for development of some swelling beneath the left chest incision that looked like a hematoma or seroma. Since hospital discharge the patient reports that he has been doing well. He has had no fever, cough or sputum. He returned last week for staple removal and after they were removed one area of the chest incision opened and drained a small amount of liquid hematoma. He has been keeping a dressing over this area.   Current Outpatient Prescriptions  Medication Sig Dispense Refill  . acetaminophen (TYLENOL) 500 MG tablet Take 2 tablets (1,000 mg total) by mouth every 6 (six) hours as needed for mild pain or fever. 30 tablet 0  . amLODipine-valsartan (EXFORGE) 5-160 MG tablet Take 1 tablet by mouth daily. Currently taking 1/2 tab po every day  4  . atorvastatin (LIPITOR) 10 MG tablet Take 10 mg by mouth daily.    Marland Kitchen. dextromethorphan-guaiFENesin (MUCINEX DM) 30-600 MG 12hr tablet Take 1 tablet by mouth 2 (two) times daily as needed for cough.    Marland Kitchen. HYDROcodone-homatropine (HYCODAN) 5-1.5 MG/5ML syrup Take 5 mLs by mouth at bedtime as needed.    Marland Kitchen. ibuprofen (ADVIL,MOTRIN) 200 MG tablet Take 200 mg by mouth every 6 (six) hours as needed for mild pain or moderate pain.    Marland Kitchen. oxyCODONE (OXY IR/ROXICODONE) 5 MG immediate release tablet Take 1-2 tablets (5-10 mg total) by mouth every 4 (four) hours as needed for severe pain. 30 tablet 0   No current facility-administered medications for this visit.   Review of Systems   Constitutional: negative HENT: Negative.  Eyes: Negative.  Respiratory: negative Negative for hemoptysis.  Cardiovascular: Negative for orthopnea, leg swelling and PND.    Gastrointestinal: Negative.  Genitourinary: Negative.  Musculoskeletal: Negative.  Skin: Negative.  Neurological: Negative.  Endo/Heme/Allergies: Negative.  Psychiatric/Behavioral: Negative.  Physical Exam: BP 155/100 mmHg  Pulse 94  Resp 20  Ht 5\' 6"  (1.676 m)  Wt 165 lb (74.844 kg)  BMI 26.64 kg/m2  SpO2 98%  Constitutional: He is oriented to person, place, and time. He appears well-developed and well-nourished.  HENT:  Head: Normocephalic and atraumatic.  Mouth/Throat: Oropharynx is clear and moist.  Eyes: Conjunctivae and EOM are normal. Pupils are equal, round, and reactive to light.  Neck: Normal range of motion. Neck supple. No thyromegaly present.  Cardiovascular: Normal rate, regular rhythm, normal heart sounds and intact distal pulses.  No murmur heard. Respiratory: Effort normal. No respiratory distress. He exhibits no tenderness.   GI: Soft. Bowel sounds are normal. He exhibits no distension and no mass. There is no tenderness.  Musculoskeletal: Normal range of motion. He exhibits no edema.  Lymphadenopathy:   He has no cervical adenopathy.  Neurological: He is alert and oriented to person, place, and time.  Skin: Skin is warm and dry.  Psychiatric: He has a normal mood and affect.   Diagnostic Tests:  None   Impression:  He has recovered well from a pulmonary standpoint from his lung abscess, pneumonia and empyema. He has a subcutaneous hematoma beneath the left thoracotomy incision. He had small subcutaneous skin flaps mobilized over the muscles to allow a muscle-sparing approach and must have bled into that space. I think the best option is to  drain this completely and close the wound in the OR. I think if it is left alone it will likely get infected and then it will have to opened up and treated with dressing changes which will be a larger problem for him. I discussed the procedure with him including alternatives, benefits, and risks including but  not limited to bleeding, infection, recurrent hematoma or seroma and he understands and agrees to proceed.   Plan:  Drainage of left chest incision hematoma and closure of wound.   Alleen BorneBryan K Jae Bruck, MD [facility] [phone]

## 2016-06-03 NOTE — Discharge Instructions (Signed)
May remove dressing in the morning and shower. Incision is covered with Dermabond surgical adhesive and is waterproof. Keep a dry dressing over the incision when not showering to protect the incision.   Resume normal activity tomorrow but no heavy lifting until seen back in the office.  May resume driving tomorrow.

## 2016-06-03 NOTE — Op Note (Signed)
  CARDIOVASCULAR SURGERY OPERATIVE NOTE  06/03/2016  Surgeon:  Alleen BorneBryan K. Tristin Gladman, MD  First Assistant: none   Preoperative Diagnosis:  Left chest wall incisional hematoma   Postoperative Diagnosis:  Same   Procedure:  1. Incision and drainage of left chest wall incisional hematoma  Anesthesia:  General Endotracheal   Clinical History/Surgical Indication:  The patient is a 50 year old gentleman who underwent left muscle-sparing thoracotomy for drainage of an empyema and decortication of the left lung with debridement of a left upper lobe lung abscess on 05/13/2016. The patient's early postoperative recovery while in the hospital was notable for development of some swelling beneath the left chest incision that looked like a hematoma or seroma. Since hospital discharge the patient reports that he has been doing well. He has had no fever, cough or sputum. He returned last week for staple removal and after they were removed one area of the chest incision opened and drained a small amount of liquid hematoma. He has been keeping a dressing over this area.  He has recovered well from a pulmonary standpoint from his lung abscess, pneumonia and empyema. He has a subcutaneous hematoma beneath the left thoracotomy incision. He had small subcutaneous skin flaps mobilized over the muscles to allow a muscle-sparing approach and must have bled into that space. I think the best option is to drain this completely and close the wound in the OR. I think if it is left alone it will likely get infected and then it will have to opened up and treated with dressing changes which will be a larger problem for him. I discussed the procedure with him including alternatives, benefits, and risks including but not limited to bleeding, infection, recurrent hematoma or seroma and he understands and agrees to proceed.    Operative  Procedure:  The patient was seen in the preoperative holding area and the left side of the chest was signed by me. The consent was signed. Preop Ancef was given. He was taken back to the OR and placed on the table in the supine position. After induction of general LMA anesthesia a bump was place behind the left side of the back to elevate the incision. The left arm was placed across the chest. The left chest was prepped with betadine soap and solution and draped in the usual sterile manner. A time out was taken and the proper patient, operation were confirmed with the nurses and anesthesia. The left chest incision was opened and the subcutaneous suture removed. A large hematoma was evacuated between the muscle and subcutaneous tissue. There was no active bleeding. The wound was irrigated with saline. Then the pocket was closed using interrupted 3-0 vicryl sutures between the subcutaneous tissue and the muscle fascia. Then the subcutaneous tissue was closed using continuous 3-0 vicryl suture. The sin was closed using a 3-0 vicryl subcuticular suture. Dermabond was applied followed by a dry sterile dressing. The sponge, needle and instrument counts were correct according to the nurses. The patient was awakened and the LMA removed. The patient was then transported to the PACU in stable condition.

## 2016-06-03 NOTE — Anesthesia Preprocedure Evaluation (Addendum)
Anesthesia Evaluation  Patient identified by MRN, date of birth, ID band Patient awake    Reviewed: Allergy & Precautions, NPO status , Patient's Chart, lab work & pertinent test results  Airway Mallampati: II  TM Distance: >3 FB Neck ROM: Full    Dental no notable dental hx. (+) Teeth Intact, Dental Advisory Given   Pulmonary pneumonia, former smoker,    Pulmonary exam normal breath sounds clear to auscultation       Cardiovascular hypertension, Pt. on medications Normal cardiovascular exam Rhythm:Regular Rate:Normal     Neuro/Psych negative neurological ROS  negative psych ROS   GI/Hepatic negative GI ROS, Neg liver ROS,   Endo/Other  negative endocrine ROS  Renal/GU negative Renal ROS     Musculoskeletal negative musculoskeletal ROS (+)   Abdominal   Peds  Hematology negative hematology ROS (+)   Anesthesia Other Findings   Reproductive/Obstetrics                            Anesthesia Physical  Anesthesia Plan  ASA: II  Anesthesia Plan: General   Post-op Pain Management:    Induction: Intravenous  Airway Management Planned: Oral ETT  Additional Equipment:   Intra-op Plan:   Post-operative Plan: Extubation in OR  Informed Consent: I have reviewed the patients History and Physical, chart, labs and discussed the procedure including the risks, benefits and alternatives for the proposed anesthesia with the patient or authorized representative who has indicated his/her understanding and acceptance.   Dental advisory given  Plan Discussed with: CRNA and Anesthesiologist  Anesthesia Plan Comments:         Anesthesia Quick Evaluation

## 2016-06-04 ENCOUNTER — Encounter (HOSPITAL_COMMUNITY): Payer: Self-pay | Admitting: Surgery

## 2016-06-30 ENCOUNTER — Ambulatory Visit (INDEPENDENT_AMBULATORY_CARE_PROVIDER_SITE_OTHER): Payer: Self-pay | Admitting: Surgery

## 2016-06-30 ENCOUNTER — Encounter: Payer: Self-pay | Admitting: Surgery

## 2016-06-30 VITALS — BP 138/89 | HR 75 | Resp 16 | Ht 66.0 in | Wt 165.0 lb

## 2016-06-30 DIAGNOSIS — Z09 Encounter for follow-up examination after completed treatment for conditions other than malignant neoplasm: Secondary | ICD-10-CM

## 2016-06-30 DIAGNOSIS — T148 Other injury of unspecified body region: Secondary | ICD-10-CM

## 2016-06-30 DIAGNOSIS — T148XXA Other injury of unspecified body region, initial encounter: Secondary | ICD-10-CM

## 2016-06-30 DIAGNOSIS — J869 Pyothorax without fistula: Secondary | ICD-10-CM

## 2016-06-30 NOTE — Progress Notes (Signed)
      HPI: Patient returns for routine postoperative follow-up having undergone incision and drainage of a chest wall incisional hematoma after thoractomy on 06/03/2016. He has felt well and not noticed any further swelling. There is minimal discomfort and he is anxious to return to work.   Current Outpatient Prescriptions  Medication Sig Dispense Refill  . acetaminophen (TYLENOL) 500 MG tablet Take 2 tablets (1,000 mg total) by mouth every 6 (six) hours as needed for mild pain or fever. 30 tablet 0  . amLODipine-valsartan (EXFORGE) 5-160 MG tablet Take 1 tablet by mouth daily.   4  . atorvastatin (LIPITOR) 10 MG tablet Take 10 mg by mouth every evening.      No current facility-administered medications for this visit.    Physical Exam: BP 138/89 mmHg  Pulse 75  Resp 16  Ht 5\' 6"  (1.676 m)  Wt 165 lb (74.844 kg)  BMI 26.64 kg/m2  SpO2 98% He looks well The left chest incision is healing well with no swelling beneath it and no sign of infection.  Diagnostic Tests:  None today  Impression:  The wound is healing well and no further treatment is needed.  He wants to return to work which should be fine at this point. He knows to be careful with any lifting or strenuous activity and I would not anticipate any problems.  Plan:  Follow up as needed.   Alleen BorneBryan K Avrey Flanagin, MD [facility] [phone]

## 2017-04-11 IMAGING — CR DG CHEST 1V PORT
1 series · 1 of 1 positions shown · non-contrast
Comparison: Chest radiograph 05/10/2016.

CLINICAL DATA: Patient status post left thoracotomy.

EXAM:
PORTABLE CHEST 1 VIEW

[AP]
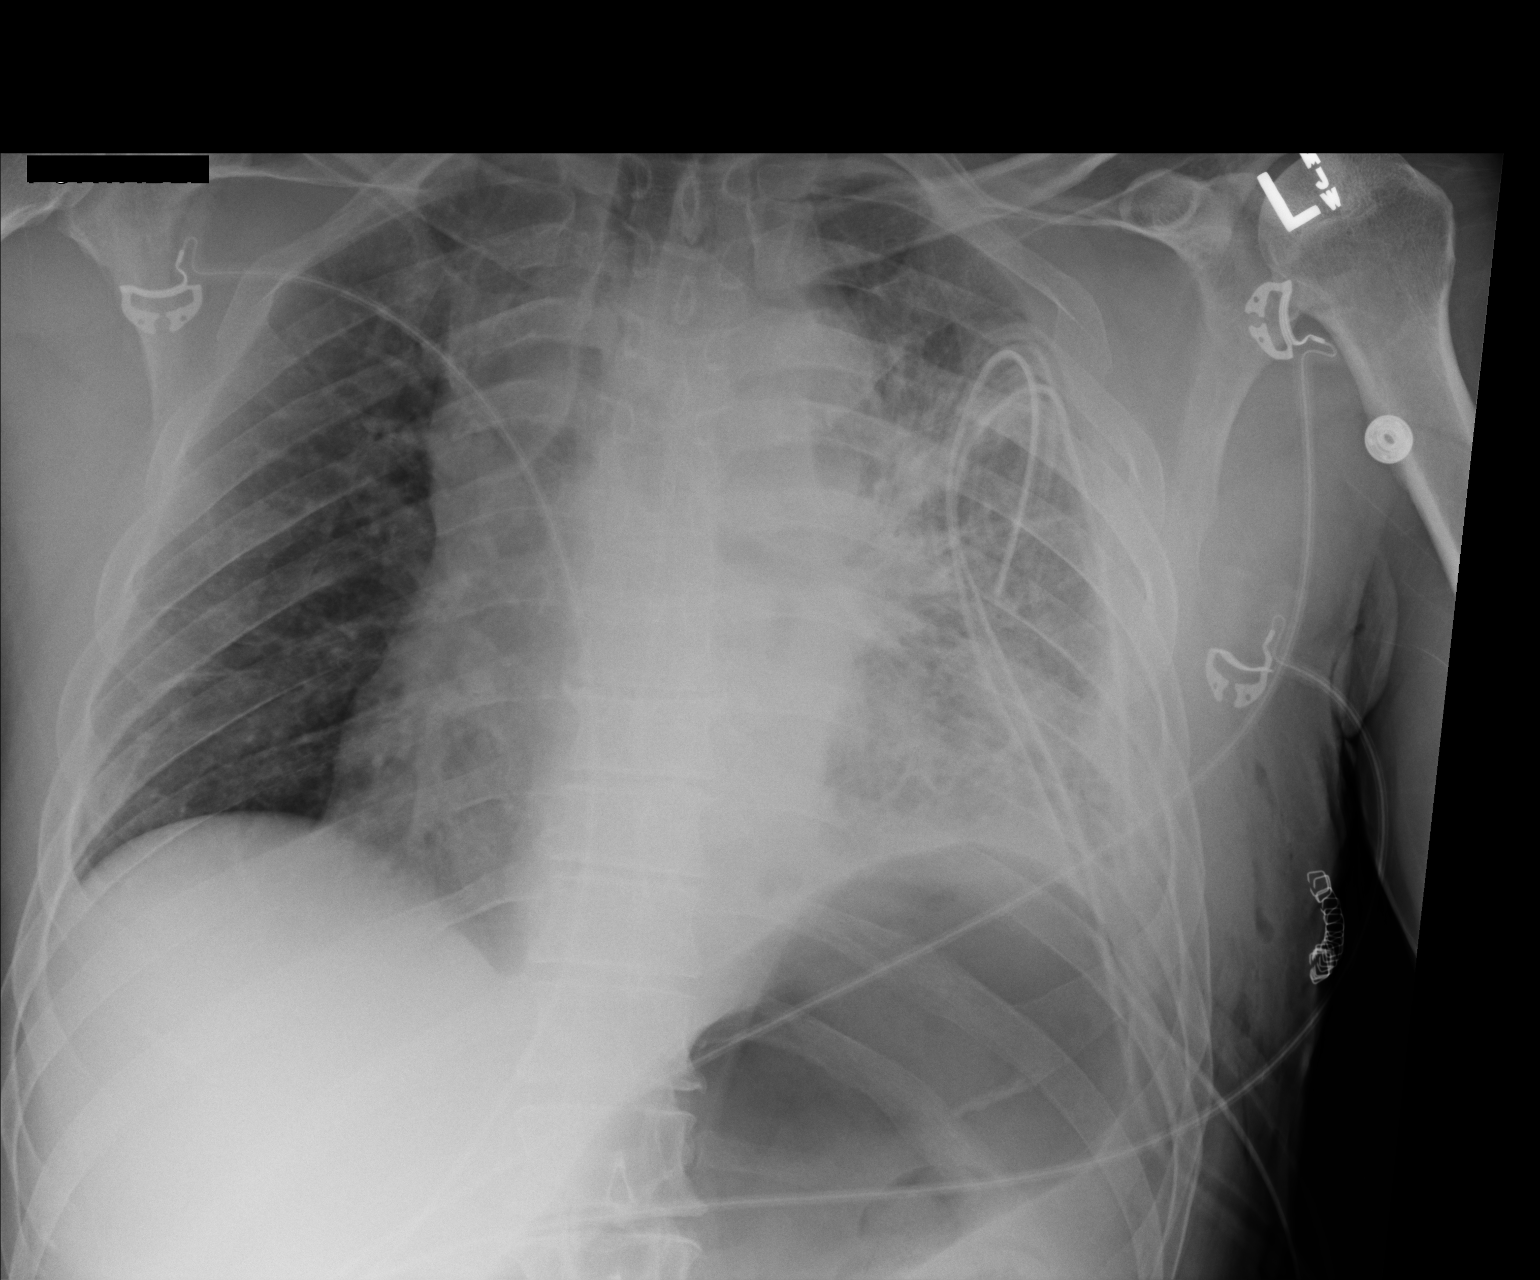

[1 of 1 positions shown; findings below may reference images not displayed]

FINDINGS: Monitoring leads overlie the patient. Stable enlarged cardiac and
mediastinal contours. Two left chest tubes are in place. Significant
interval decrease in size of now small left pleural effusion.
Diffuse heterogeneous opacities throughout the left lung. Right lung
is clear. No pneumothorax.
IMPRESSION: Two left chest tubes in place. Significant interval decrease in size
of now small left pleural effusion.

Diffuse heterogeneous opacities throughout the left lung may
represent reexpansion edema, infection and/or associated
atelectasis. Recommend continued radiographic followup to ensure
resolution and exclude the possibility of pulmonary nodule or mass.

## 2017-04-12 IMAGING — CR DG CHEST 1V PORT
1 series · 1 of 1 positions shown · non-contrast
Comparison: 05/13/2016

CLINICAL DATA: Followup lung surgery

EXAM:
PORTABLE CHEST 1 VIEW

[AP]
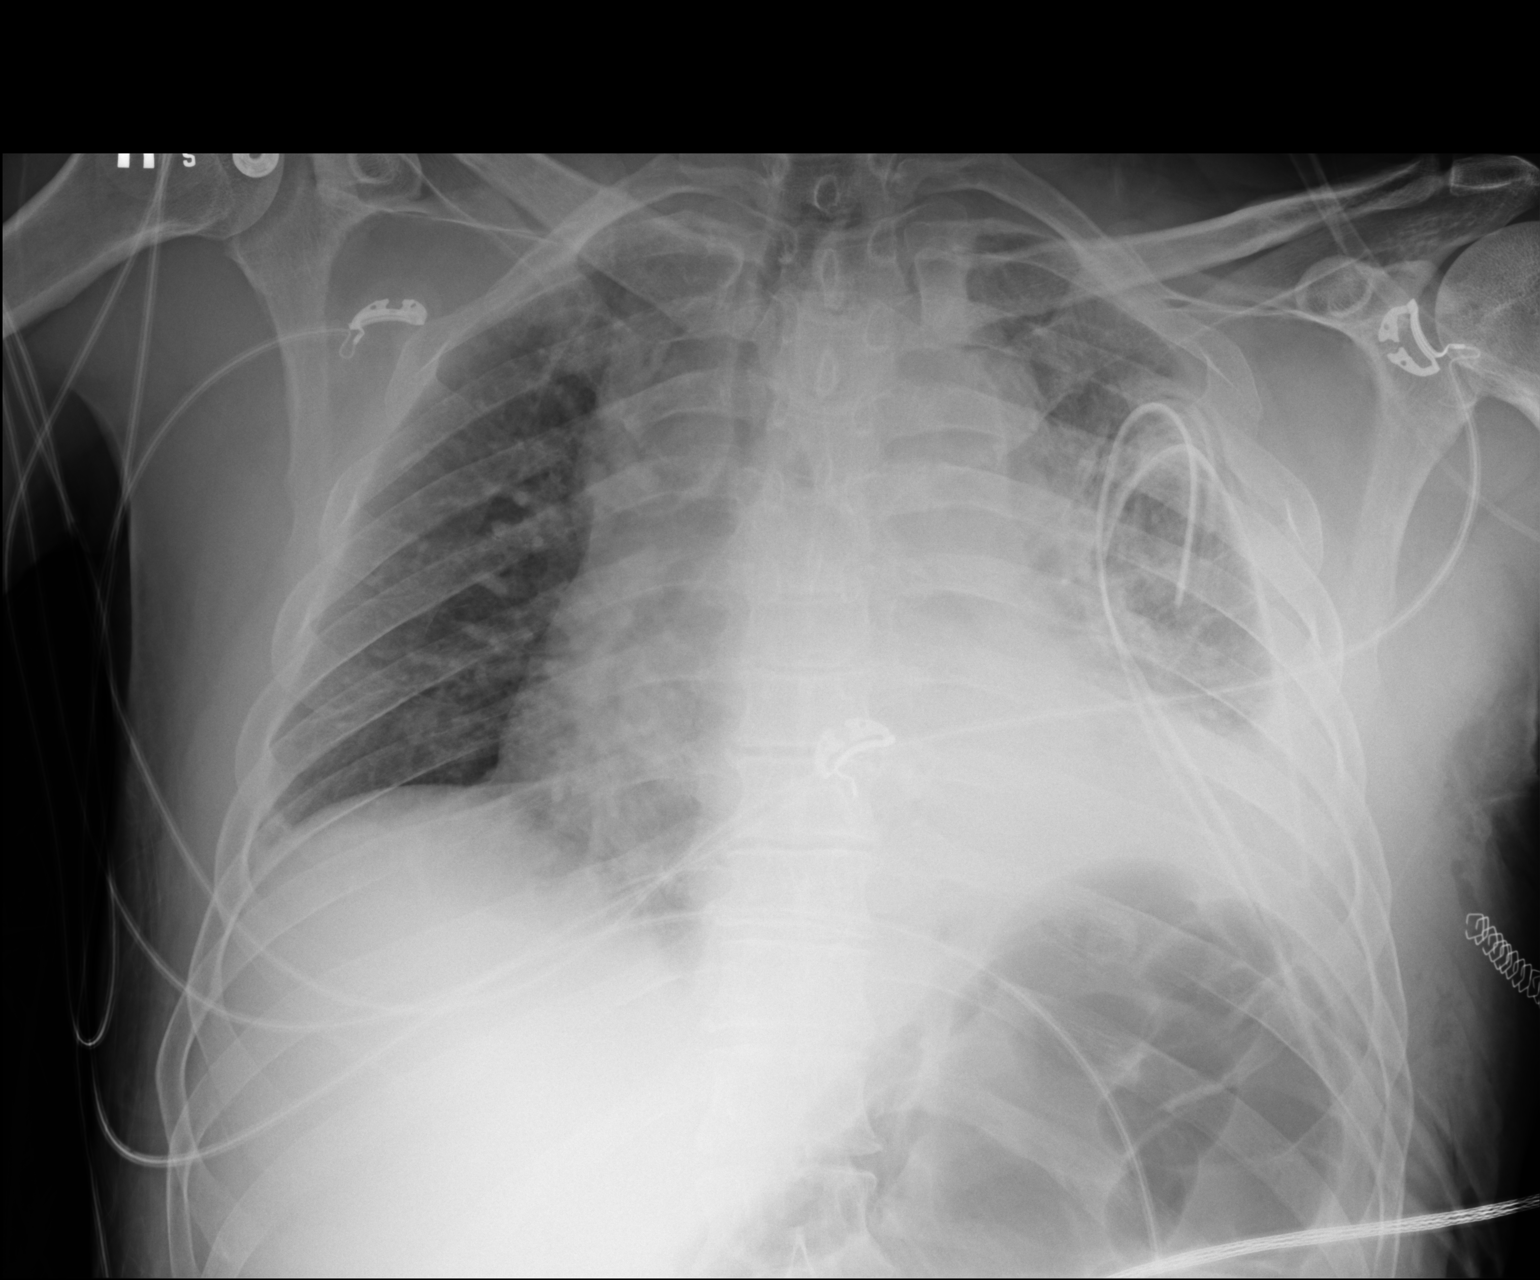

[1 of 1 positions shown; findings below may reference images not displayed]

FINDINGS: Right lung is clear. Cardiac enlargement stable. Stable chest tubes
on the left. Left pleural effusion again identified. Mildly
increased density left lung base could represent increased
consolidation and or increase in the size of left pleural effusion
when compared to prior study. No pneumothorax.
IMPRESSION: Similar appearance to prior study but with increased left base
opacity

## 2017-04-13 IMAGING — CR DG CHEST 1V PORT
1 series · 1 of 1 positions shown · non-contrast
Comparison: 05/14/2016

CLINICAL DATA: Thoracotomy, shortness of breath

EXAM:
PORTABLE CHEST 1 VIEW

[AP]
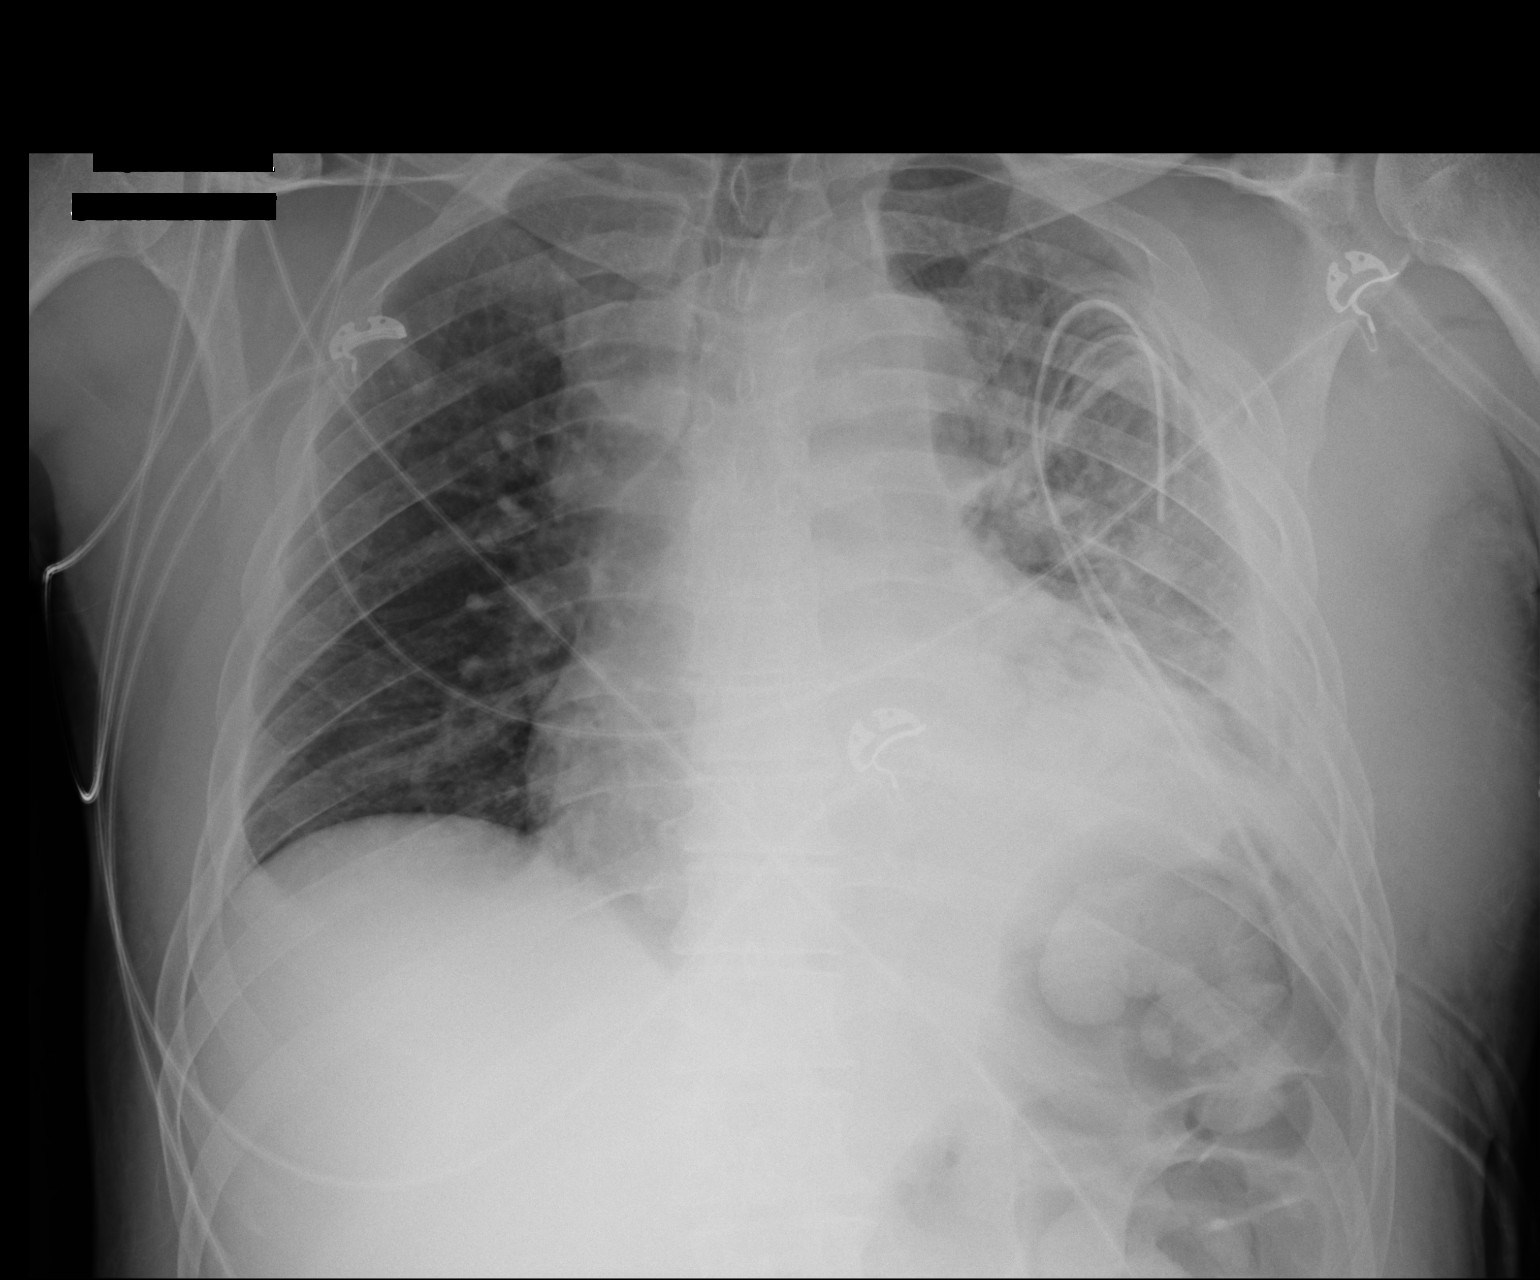

[1 of 1 positions shown; findings below may reference images not displayed]

FINDINGS: Two left chest tubes are stable position. Stable residual left
effusion versus diffuse left pleural thickening. Persistent lingula
and left basilar dense consolidation/ airspace disease. Stable
aeration of the left upper lobe. No significant change in left hemi
thorax. No developing pneumothorax. Right lung remains clear. Heart
is enlarged. Overall little interval change.
IMPRESSION: Stable postoperative appearance of the left hemi thorax. No interval
change compared 05/14/2016.

## 2017-04-14 IMAGING — DX DG CHEST 2V
2 series · 2 of 2 positions shown · non-contrast
Comparison: 05/15/2016

CLINICAL DATA: Atelectasis

EXAM:
CHEST  2 VIEW

[chest pa]
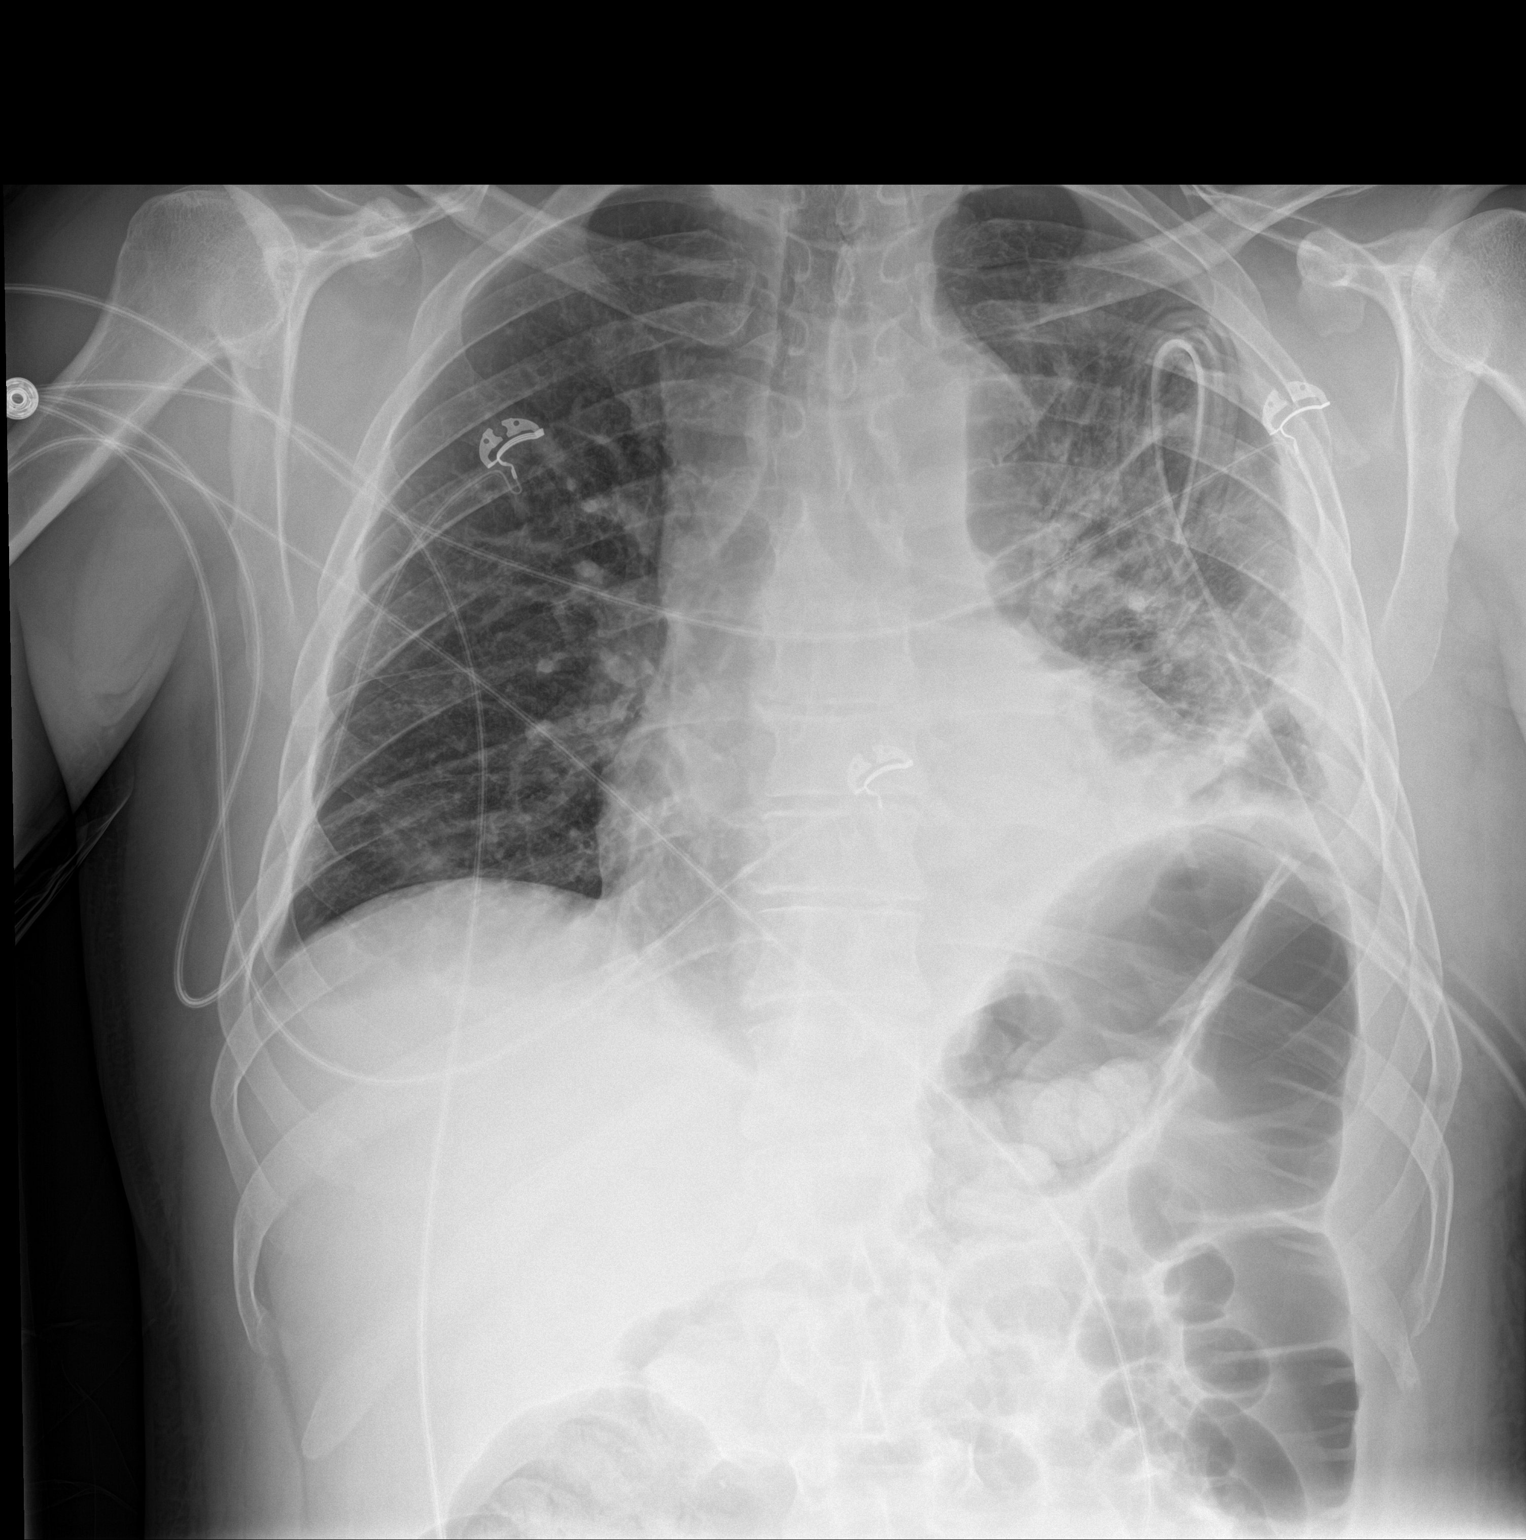

[chest lat]
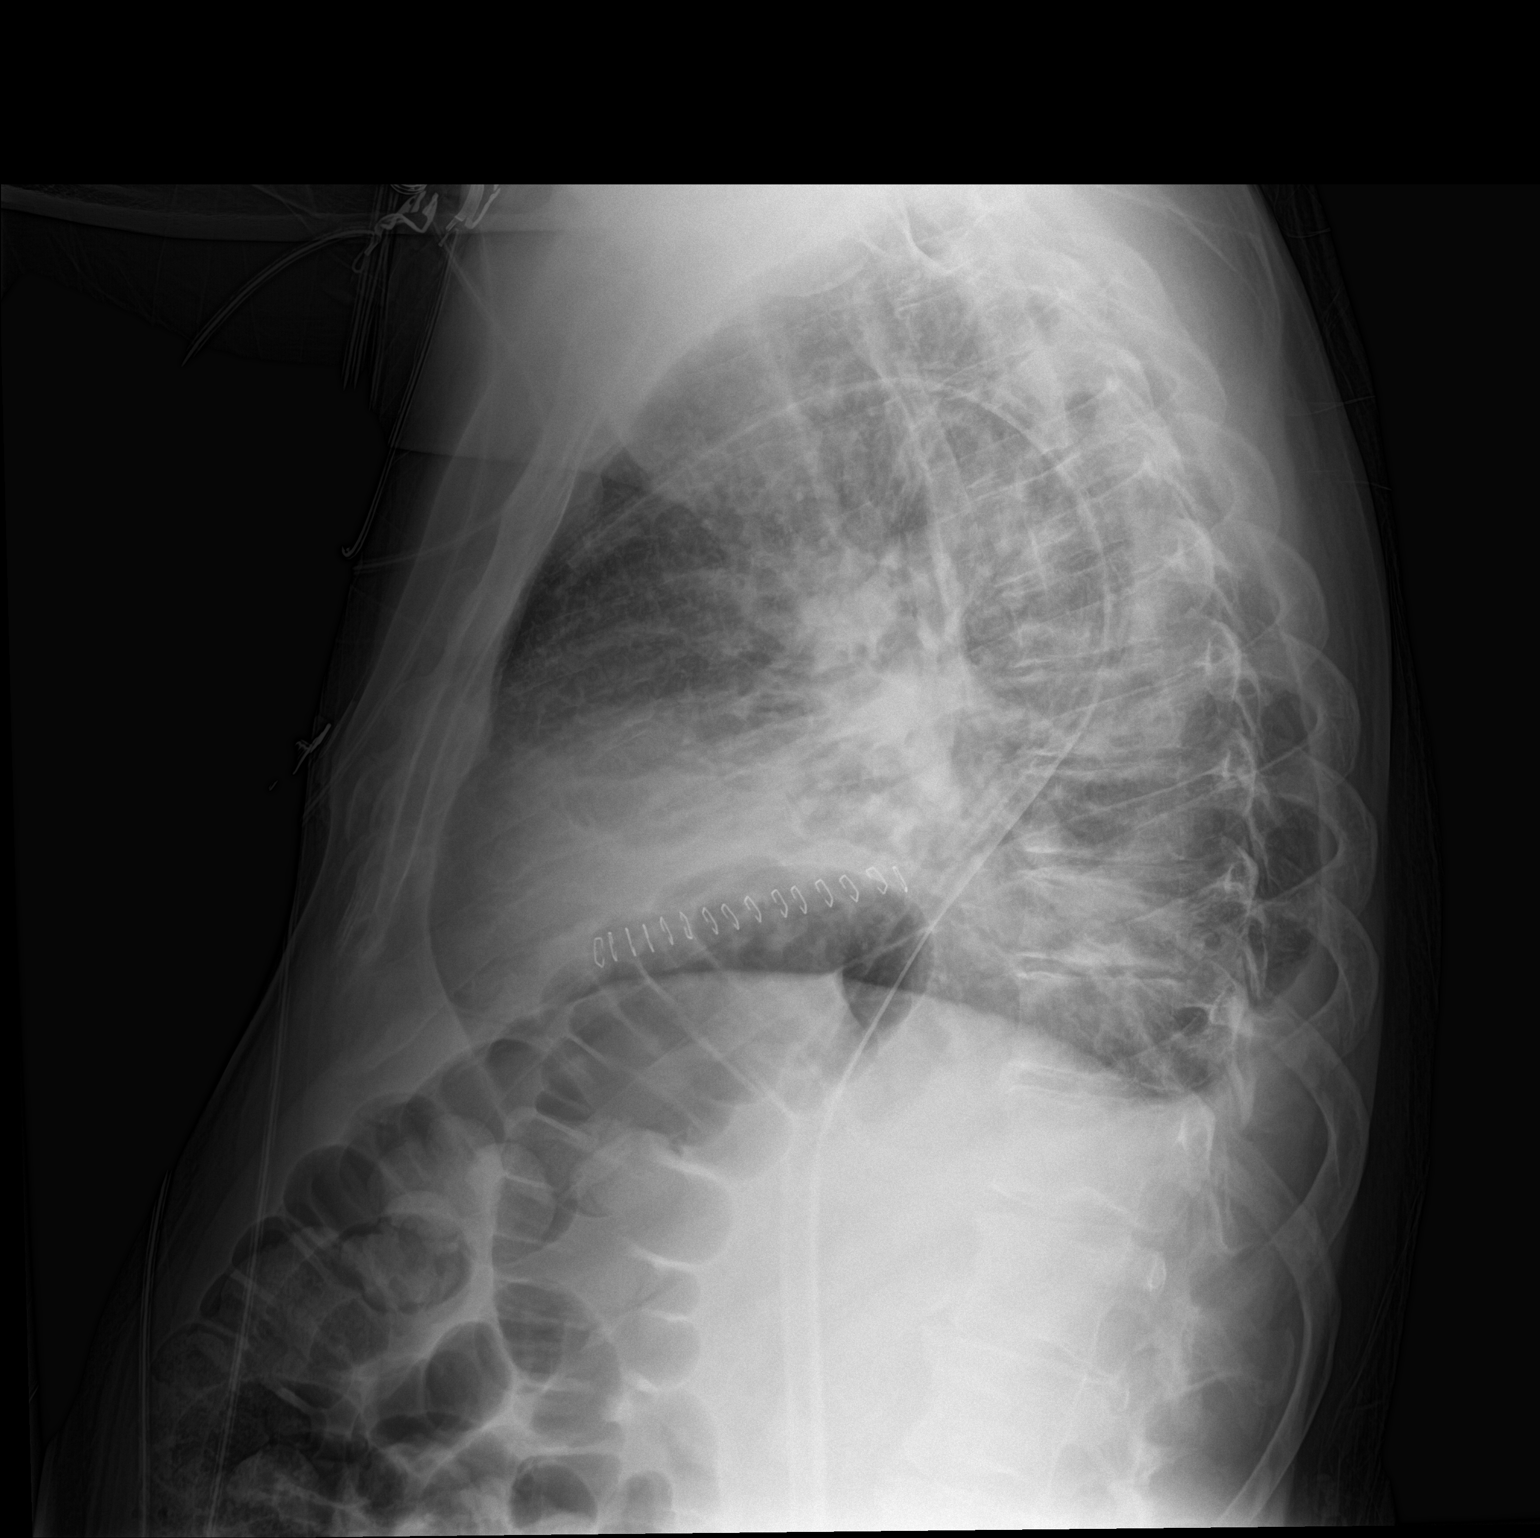

[2 of 2 positions shown; findings below may reference images not displayed]

FINDINGS: Left chest tube remains in place, unchanged. No pneumothorax.
Moderate loculated left lateral pleural effusion again noted,
stable. Diffuse left lung atelectasis or infiltrate stable. Mild
cardiomegaly. No focal opacity on the right.
IMPRESSION: No significant change since prior study.

## 2017-12-20 DIAGNOSIS — Z23 Encounter for immunization: Secondary | ICD-10-CM | POA: Diagnosis not present

## 2017-12-20 DIAGNOSIS — I10 Essential (primary) hypertension: Secondary | ICD-10-CM | POA: Diagnosis not present

## 2017-12-20 DIAGNOSIS — E785 Hyperlipidemia, unspecified: Secondary | ICD-10-CM | POA: Diagnosis not present

## 2017-12-20 DIAGNOSIS — Z0000 Encounter for general adult medical examination without abnormal findings: Secondary | ICD-10-CM | POA: Diagnosis not present

## 2018-03-22 DIAGNOSIS — Z1211 Encounter for screening for malignant neoplasm of colon: Secondary | ICD-10-CM | POA: Diagnosis not present

## 2018-06-19 DIAGNOSIS — E785 Hyperlipidemia, unspecified: Secondary | ICD-10-CM | POA: Diagnosis not present

## 2018-06-19 DIAGNOSIS — I10 Essential (primary) hypertension: Secondary | ICD-10-CM | POA: Diagnosis not present

## 2018-10-06 DIAGNOSIS — Z23 Encounter for immunization: Secondary | ICD-10-CM | POA: Diagnosis not present

## 2018-12-21 DIAGNOSIS — J209 Acute bronchitis, unspecified: Secondary | ICD-10-CM | POA: Diagnosis not present

## 2018-12-21 DIAGNOSIS — R6889 Other general symptoms and signs: Secondary | ICD-10-CM | POA: Diagnosis not present

## 2019-11-05 DIAGNOSIS — Z23 Encounter for immunization: Secondary | ICD-10-CM | POA: Diagnosis not present

## 2019-11-05 DIAGNOSIS — E782 Mixed hyperlipidemia: Secondary | ICD-10-CM | POA: Diagnosis not present

## 2019-11-05 DIAGNOSIS — I10 Essential (primary) hypertension: Secondary | ICD-10-CM | POA: Diagnosis not present

## 2019-12-04 DIAGNOSIS — Z6827 Body mass index (BMI) 27.0-27.9, adult: Secondary | ICD-10-CM | POA: Diagnosis not present

## 2019-12-04 DIAGNOSIS — R52 Pain, unspecified: Secondary | ICD-10-CM | POA: Diagnosis not present

## 2019-12-04 DIAGNOSIS — R0981 Nasal congestion: Secondary | ICD-10-CM | POA: Diagnosis not present

## 2019-12-04 DIAGNOSIS — R509 Fever, unspecified: Secondary | ICD-10-CM | POA: Diagnosis not present

## 2020-04-15 DIAGNOSIS — R7303 Prediabetes: Secondary | ICD-10-CM | POA: Diagnosis not present

## 2020-04-15 DIAGNOSIS — Z13220 Encounter for screening for lipoid disorders: Secondary | ICD-10-CM | POA: Diagnosis not present

## 2020-04-15 DIAGNOSIS — Z125 Encounter for screening for malignant neoplasm of prostate: Secondary | ICD-10-CM | POA: Diagnosis not present

## 2020-04-15 DIAGNOSIS — Z0000 Encounter for general adult medical examination without abnormal findings: Secondary | ICD-10-CM | POA: Diagnosis not present

## 2021-01-13 DIAGNOSIS — R3915 Urgency of urination: Secondary | ICD-10-CM | POA: Diagnosis not present

## 2021-01-13 DIAGNOSIS — R109 Unspecified abdominal pain: Secondary | ICD-10-CM | POA: Diagnosis not present

## 2021-01-13 DIAGNOSIS — N410 Acute prostatitis: Secondary | ICD-10-CM | POA: Diagnosis not present

## 2021-01-13 DIAGNOSIS — N452 Orchitis: Secondary | ICD-10-CM | POA: Diagnosis not present

## 2021-04-27 DIAGNOSIS — Z125 Encounter for screening for malignant neoplasm of prostate: Secondary | ICD-10-CM | POA: Diagnosis not present

## 2021-04-27 DIAGNOSIS — Z0000 Encounter for general adult medical examination without abnormal findings: Secondary | ICD-10-CM | POA: Diagnosis not present

## 2021-04-27 DIAGNOSIS — R7303 Prediabetes: Secondary | ICD-10-CM | POA: Diagnosis not present

## 2021-04-27 DIAGNOSIS — E782 Mixed hyperlipidemia: Secondary | ICD-10-CM | POA: Diagnosis not present

## 2021-04-27 DIAGNOSIS — Z23 Encounter for immunization: Secondary | ICD-10-CM | POA: Diagnosis not present

## 2022-05-07 DIAGNOSIS — H6123 Impacted cerumen, bilateral: Secondary | ICD-10-CM | POA: Diagnosis not present

## 2022-05-07 DIAGNOSIS — R7309 Other abnormal glucose: Secondary | ICD-10-CM | POA: Diagnosis not present

## 2022-05-07 DIAGNOSIS — Z0000 Encounter for general adult medical examination without abnormal findings: Secondary | ICD-10-CM | POA: Diagnosis not present

## 2022-05-07 DIAGNOSIS — Z125 Encounter for screening for malignant neoplasm of prostate: Secondary | ICD-10-CM | POA: Diagnosis not present

## 2022-05-07 DIAGNOSIS — I10 Essential (primary) hypertension: Secondary | ICD-10-CM | POA: Diagnosis not present

## 2022-05-07 DIAGNOSIS — E782 Mixed hyperlipidemia: Secondary | ICD-10-CM | POA: Diagnosis not present

## 2022-08-18 DIAGNOSIS — Z23 Encounter for immunization: Secondary | ICD-10-CM | POA: Diagnosis not present

## 2022-08-18 DIAGNOSIS — E782 Mixed hyperlipidemia: Secondary | ICD-10-CM | POA: Diagnosis not present

## 2023-05-11 DIAGNOSIS — R7309 Other abnormal glucose: Secondary | ICD-10-CM | POA: Diagnosis not present

## 2023-05-11 DIAGNOSIS — Z125 Encounter for screening for malignant neoplasm of prostate: Secondary | ICD-10-CM | POA: Diagnosis not present

## 2023-05-11 DIAGNOSIS — I10 Essential (primary) hypertension: Secondary | ICD-10-CM | POA: Diagnosis not present

## 2023-05-11 DIAGNOSIS — Z0000 Encounter for general adult medical examination without abnormal findings: Secondary | ICD-10-CM | POA: Diagnosis not present

## 2023-05-11 DIAGNOSIS — E782 Mixed hyperlipidemia: Secondary | ICD-10-CM | POA: Diagnosis not present

## 2023-09-28 DIAGNOSIS — Z23 Encounter for immunization: Secondary | ICD-10-CM | POA: Diagnosis not present

## 2024-05-21 DIAGNOSIS — I10 Essential (primary) hypertension: Secondary | ICD-10-CM | POA: Diagnosis not present

## 2024-05-21 DIAGNOSIS — E782 Mixed hyperlipidemia: Secondary | ICD-10-CM | POA: Diagnosis not present

## 2024-05-21 DIAGNOSIS — R7309 Other abnormal glucose: Secondary | ICD-10-CM | POA: Diagnosis not present

## 2024-05-21 DIAGNOSIS — Z0000 Encounter for general adult medical examination without abnormal findings: Secondary | ICD-10-CM | POA: Diagnosis not present

## 2024-05-21 DIAGNOSIS — Z125 Encounter for screening for malignant neoplasm of prostate: Secondary | ICD-10-CM | POA: Diagnosis not present

## 2024-09-12 ENCOUNTER — Other Ambulatory Visit: Payer: Self-pay | Admitting: Family Medicine

## 2024-09-12 DIAGNOSIS — E782 Mixed hyperlipidemia: Secondary | ICD-10-CM

## 2024-11-14 ENCOUNTER — Ambulatory Visit (INDEPENDENT_AMBULATORY_CARE_PROVIDER_SITE_OTHER): Payer: Self-pay

## 2024-11-14 DIAGNOSIS — E782 Mixed hyperlipidemia: Secondary | ICD-10-CM
